# Patient Record
Sex: Female | Born: 1960 | Race: White | Hispanic: No | Marital: Married | Smoking: Light tobacco smoker
Health system: Southern US, Community
[De-identification: ages and names within clinical notes are randomized; demographics above are authoritative.]

## PROBLEM LIST (undated history)

## (undated) DIAGNOSIS — I10 Essential (primary) hypertension: Secondary | ICD-10-CM

## (undated) DIAGNOSIS — K589 Irritable bowel syndrome without diarrhea: Secondary | ICD-10-CM

## (undated) DIAGNOSIS — J42 Unspecified chronic bronchitis: Secondary | ICD-10-CM

## (undated) DIAGNOSIS — K4090 Unilateral inguinal hernia, without obstruction or gangrene, not specified as recurrent: Secondary | ICD-10-CM

## (undated) DIAGNOSIS — I639 Cerebral infarction, unspecified: Secondary | ICD-10-CM

## (undated) DIAGNOSIS — Q5128 Other doubling of uterus, other specified: Secondary | ICD-10-CM

## (undated) DIAGNOSIS — F329 Major depressive disorder, single episode, unspecified: Secondary | ICD-10-CM

## (undated) DIAGNOSIS — R011 Cardiac murmur, unspecified: Secondary | ICD-10-CM

## (undated) DIAGNOSIS — E7800 Pure hypercholesterolemia, unspecified: Secondary | ICD-10-CM

## (undated) DIAGNOSIS — R569 Unspecified convulsions: Secondary | ICD-10-CM

## (undated) DIAGNOSIS — T7840XA Allergy, unspecified, initial encounter: Secondary | ICD-10-CM

## (undated) DIAGNOSIS — F32A Depression, unspecified: Secondary | ICD-10-CM

## (undated) DIAGNOSIS — G3184 Mild cognitive impairment, so stated: Secondary | ICD-10-CM

## (undated) DIAGNOSIS — R112 Nausea with vomiting, unspecified: Secondary | ICD-10-CM

## (undated) DIAGNOSIS — M1990 Unspecified osteoarthritis, unspecified site: Secondary | ICD-10-CM

## (undated) DIAGNOSIS — Q512 Other doubling of uterus, unspecified: Secondary | ICD-10-CM

## (undated) DIAGNOSIS — K635 Polyp of colon: Secondary | ICD-10-CM

## (undated) DIAGNOSIS — R51 Headache: Secondary | ICD-10-CM

## (undated) DIAGNOSIS — R519 Headache, unspecified: Secondary | ICD-10-CM

## (undated) DIAGNOSIS — IMO0001 Reserved for inherently not codable concepts without codable children: Secondary | ICD-10-CM

## (undated) DIAGNOSIS — F419 Anxiety disorder, unspecified: Secondary | ICD-10-CM

## (undated) DIAGNOSIS — K219 Gastro-esophageal reflux disease without esophagitis: Secondary | ICD-10-CM

## (undated) DIAGNOSIS — G43909 Migraine, unspecified, not intractable, without status migrainosus: Secondary | ICD-10-CM

## (undated) DIAGNOSIS — I619 Nontraumatic intracerebral hemorrhage, unspecified: Secondary | ICD-10-CM

## (undated) DIAGNOSIS — Z9889 Other specified postprocedural states: Secondary | ICD-10-CM

## (undated) HISTORY — DX: Nontraumatic intracerebral hemorrhage, unspecified: I61.9

## (undated) HISTORY — DX: Essential (primary) hypertension: I10

## (undated) HISTORY — DX: Polyp of colon: K63.5

## (undated) HISTORY — PX: EXCISIONAL HEMORRHOIDECTOMY: SHX1541

## (undated) HISTORY — PX: COLONOSCOPY W/ BIOPSIES AND POLYPECTOMY: SHX1376

## (undated) HISTORY — DX: Other and unspecified doubling of uterus: Q51.28

## (undated) HISTORY — DX: Other doubling of uterus, unspecified: Q51.20

---

## 1979-04-20 HISTORY — PX: ANAL FISSURE REPAIR: SHX2312

## 1996-04-19 HISTORY — PX: TUBAL LIGATION: SHX77

## 2001-04-19 HISTORY — PX: VAGINAL HYSTERECTOMY: SUR661

## 2004-04-19 DIAGNOSIS — I619 Nontraumatic intracerebral hemorrhage, unspecified: Secondary | ICD-10-CM

## 2004-04-19 HISTORY — DX: Nontraumatic intracerebral hemorrhage, unspecified: I61.9

## 2004-12-23 ENCOUNTER — Encounter: Payer: Self-pay | Admitting: Family Medicine

## 2005-04-19 HISTORY — PX: ABDOMINOPLASTY: SUR9

## 2005-04-19 HISTORY — PX: AUGMENTATION MAMMAPLASTY: SUR837

## 2009-05-20 HISTORY — PX: BREAST IMPLANT REMOVAL: SHX5361

## 2010-01-08 ENCOUNTER — Encounter: Payer: Self-pay | Admitting: Family Medicine

## 2010-03-25 ENCOUNTER — Ambulatory Visit: Payer: Self-pay | Admitting: Family Medicine

## 2010-03-25 DIAGNOSIS — Z860100 Personal history of colon polyps, unspecified: Secondary | ICD-10-CM | POA: Insufficient documentation

## 2010-03-25 DIAGNOSIS — Z8673 Personal history of transient ischemic attack (TIA), and cerebral infarction without residual deficits: Secondary | ICD-10-CM

## 2010-03-25 DIAGNOSIS — F4481 Dissociative identity disorder: Secondary | ICD-10-CM

## 2010-03-25 DIAGNOSIS — I10 Essential (primary) hypertension: Secondary | ICD-10-CM | POA: Insufficient documentation

## 2010-04-23 ENCOUNTER — Encounter: Payer: Self-pay | Admitting: Family Medicine

## 2010-04-30 ENCOUNTER — Encounter: Payer: Self-pay | Admitting: Family Medicine

## 2010-04-30 DIAGNOSIS — R928 Other abnormal and inconclusive findings on diagnostic imaging of breast: Secondary | ICD-10-CM | POA: Insufficient documentation

## 2010-05-01 ENCOUNTER — Telehealth: Payer: Self-pay | Admitting: Family Medicine

## 2010-05-05 ENCOUNTER — Encounter: Payer: Self-pay | Admitting: Family Medicine

## 2010-05-19 NOTE — Assessment & Plan Note (Signed)
Summary: NEW PT TO EST/CLE   Vital Signs:  Patient profile:   50 year old female Height:      63.75 inches Weight:      130.75 pounds BMI:     22.70 Temp:     98.5 degrees F oral Pulse rate:   72 / minute Pulse rhythm:   regular BP sitting:   120 / 82  (left arm) Cuff size:   regular  Vitals Entered By: Linde Gillis CMA Duncan Dull) Apr 13, 2010 10:53 AM) CC: new patient, establish care   History of Present Illness: 50 yo with complicated PMH here to establish care without complaints.  1.  s/p parietal right hemorrhagic stroke in 2006- residual word finding difficulties, looses "time,", emotional liability.  Followed by Dr. Hyacinth Meeker (neuro), along with neuropsych.  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  2.   DID- diagnosed after her stroke (see above).  Had episodes of lapses in time.  Would see wet foot prints outside of her house after she took a shower but did not remember going outside.  Was committed in psychiatric facility at one point after a suicide attempt- took narcotics with ETOH.  Has had no further SI.  Also followed by Dr. Madelaine Etienne. On multiple medications, including Namenda 10 mg daily, Zonisamine 500 mg daily, Lexapro 30 mg daily, Cyclobenzaprine 10 mg at bedtime as needed.  3.  HTN- on Bisprolol and HCTZ.  Denies any CP, SOB, blurred vision or LE edema.    Preventive Screening-Counseling & Management  Alcohol-Tobacco     Smoking Status: current      Drug Use:  no.    Current Medications (verified): 1)  Namenda 10 Mg Tabs (Memantine Hcl) .... Take One Tablet By Mouth Daily 2)  Zonisamide 100 Mg Caps (Zonisamide) .... Take 500mg  Daily 3)  Lexapro 20 Mg Tabs (Escitalopram Oxalate) .... Take 30mg  Daily 4)  Lexapro 10 Mg Tabs (Escitalopram Oxalate) .... Take One Tablet By Mouth Daily 5)  Lisinopril-Hydrochlorothiazide 20-12.5 Mg Tabs (Lisinopril-Hydrochlorothiazide) .... Take One Tablet By Mouth Daily 6)  Fish  Oil 1000 Mg Caps (Omega-3 Fatty Acids) .... Take One Tablet By Mouth Daily 7)  Vitamin E 400 Unit Caps (Vitamin E) .... Take One Tablet Every Other Day 8)  Biotin 5000 5 Mg Caps (Biotin) .... Take One Tablet By Mouth Daily 9)  Cvs Soy Menopause 55 Mg Tabs (Soy Isoflavone) .... Take One Tablet By Mouth Daily 10)  Epipen 2-Pak 0.3 Mg/0.10ml Devi (Epinephrine) .... Use As Directed 11)  Cyclobenzaprine Hcl 10 Mg Tabs (Cyclobenzaprine Hcl) .... Take One Tablet At Bedtime As Needed 12)  Clorazepate Dipotassium 15 Mg Tabs (Clorazepate Dipotassium) .... Take One Tablet By Mouth Daily As Needed  Allergies (verified): 1)  ! 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  Married Current Smoker Alcohol use-yes Drug use-no  Risk Factors: Smoking Status: current (2010/04/13)  Past Medical History: Right parietal hemorrhagic stroke 2006- Dr. Hyacinth Meeker (neuro), Dr. Moise Boring (neuropsych) DID- Dr. 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  Married Current Smoker Alcohol use-yes Drug use-no Smoking Status:  current Drug Use:  no  Review of Systems      See HPI General:  Denies malaise. Eyes:  Denies blurring. ENT:  Denies difficulty swallowing. CV:  Denies chest pain or discomfort. Resp:  Denies shortness of breath. GI:  Denies abdominal pain and change in bowel habits. GU:  Denies discharge and dysuria. MS:  Denies joint pain, joint redness, and joint swelling. Derm:   Denies rash. Neuro:  Denies headaches. Psych:  Denies sense of great danger, suicidal thoughts/plans, thoughts of violence, unusual visions or sounds, and thoughts /plans of harming others. Endo:  Denies cold intolerance and heat intolerance. Heme:  Denies abnormal bruising and bleeding.  Physical Exam  General:  alert, well-developed, well-nourished, and well-hydrated.   Head:  normocephalic and atraumatic.   Eyes:  vision grossly intact, pupils equal, pupils round, and pupils reactive to light.   Ears:  R ear normal and L ear normal.   Nose:  no external deformity.   Mouth:  good dentition.   Neck:  No deformities, masses, or tenderness noted. Lungs:  Normal respiratory effort, chest expands symmetrically. Lungs are clear to auscultation, no crackles or wheezes. Heart:  Normal rate and regular rhythm. S1 and S2 normal without gallop, murmur, click, rub or other extra sounds. Abdomen:  Bowel sounds positive,abdomen soft and non-tender without masses, organomegaly or hernias noted. Msk:  No deformity or scoliosis noted of thoracic or lumbar spine.   Extremities:  no edema Neurologic:  alert & oriented X3, cranial nerves II-XII intact, strength normal in all extremities, gait normal, and DTRs symmetrical and normal.   Skin:  Intact without suspicious lesions or rashes Psych:  Cognition and judgment appear intact. Alert and cooperative with normal attention span and concentration. No apparent delusions, illusions, hallucinations   Impression & Recommendations:  Problem # 1:  CEREBROVASCULAR ACCIDENT, HX OF (ICD-V12.50) Assessment Unchanged Time spent with patient 45 minutes, more than 50% of this time was spent discussing her stroke and subsequent problems and referral she has had since then with pt and her husband. Awaiting records from neurology and neuropsych.  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 Lorenda Cahill. Pt is very  suspicious of this diagnosis, does not believe that she has DID. She admits to "loosing time" but not to the other features associated with this diagnosis. Sees Dr. Sherrine Maples weekly.  Problem # 3:  HYPERTENSION (ICD-401.9) Assessment: Unchanged Stable on current medications. Her updated medication list for this problem includes:    Lisinopril-hydrochlorothiazide 20-12.5 Mg Tabs (Lisinopril-hydrochlorothiazide) .Marland Kitchen... Take one tablet by mouth daily  Complete Medication List: 1)  Namenda 10 Mg Tabs (Memantine hcl) .... Take one tablet by mouth daily 2)  Zonisamide 100 Mg Caps (Zonisamide) .... Take 500mg  daily 3)  Lexapro 20 Mg Tabs (Escitalopram oxalate) .... Take 30mg  daily 4)  Lexapro 10 Mg Tabs (Escitalopram oxalate) .... Take one tablet by mouth daily 5)  Lisinopril-hydrochlorothiazide 20-12.5 Mg Tabs (Lisinopril-hydrochlorothiazide) .... Take one tablet by mouth daily 6)  Fish Oil 1000 Mg Caps (Omega-3 fatty acids) .... Take one tablet by mouth daily 7)  Vitamin E 400 Unit Caps (Vitamin e) .... Take one tablet every other day 8)  Biotin 5000 5 Mg Caps (Biotin) .... Take one tablet by mouth daily 9)  Cvs Soy Menopause 55 Mg Tabs (Soy isoflavone) .... Take one tablet by mouth daily 10)  Epipen 2-pak  0.3 Mg/0.70ml Devi (Epinephrine) .... Use as directed 11)  Cyclobenzaprine Hcl 10 Mg Tabs (Cyclobenzaprine hcl) .... Take one tablet at bedtime as needed 12)  Clorazepate Dipotassium 15 Mg Tabs (Clorazepate dipotassium) .... Take one tablet by mouth daily as needed  Other Orders: Radiology Referral (Radiology)  Patient Instructions: 1)  It was great to meet you. 2)  Please stop by to see Shirlee Limerick on your way out to set up your mammogram.   Orders Added: 1)  Radiology Referral [Radiology] 2)  New Patient Level IV [16109]    Current Allergies (reviewed today): ! PENICILLIN  TD Result Date:  02/21/2007 TD Result:  historical Flex Sig Next Due:  Not Indicated Colonoscopy Result Date:   11/27/2009 Colonoscopy Result:  historical Hemoccult Next Due:  Not Indicated PAP Next Due:  Not Indicated

## 2010-05-21 NOTE — Letter (Signed)
Summary: [facility]   Imported By: Maryln Gottron 04/03/2010 14:32:01  _____________________________________________________________________  External Attachment:    Type:   Image     Comment:   External Document

## 2010-05-21 NOTE — Progress Notes (Signed)
Summary: needs order for biopsy  Phone Note From Other Clinic   Caller: Roddie Mc with [facility]  [phone] Summary of Call: Pt is going for biopsy on 1/17 and they are asking for an order to be faxed to them at [phone], Aaron Mose.  Order needs to be for right ultrasound guided breast biopsy.  They are asking that you include in order whether you want them to give pt results, or do you want to give them. Initial call taken by: Lowella Petties CMA, AAMA,  May 01, 2010 4:17 PM

## 2010-05-21 NOTE — Letter (Signed)
Summary: [facility]  [facility]   Imported By: Lester [place] 04/10/2010 09:16:08  _____________________________________________________________________  External Attachment:    Type:   Image     Comment:   External Document

## 2010-05-21 NOTE — Letter (Signed)
Summary: Records Dated 05-08-08 thru 09-03-09/[facility]  Records Dated 05-08-08 thru 09-03-09/[facility]   Imported By: Lanelle Bal 04/10/2010 11:29:23  _____________________________________________________________________  External Attachment:    Type:   Image     Comment:   External Document

## 2010-05-21 NOTE — Miscellaneous (Signed)
Summary: Orders Update  Clinical Lists Changes  Problems: Added new problem of MAMMOGRAM, ABNORMAL, BILATERAL (ICD-793.80) Orders: Added new Referral order of Radiology Referral (Radiology) - Signed

## 2010-05-21 NOTE — Op Note (Signed)
Summary: Ultrasound Guided Needle Core Biopsy-Right,[facility]  Ultrasound Guided Needle Core Biopsy-Right,[facility]   Imported By: Beau Fanny 05/07/2010 11:06:29  _____________________________________________________________________  External Attachment:    Type:   Image     Comment:   External Document

## 2010-05-21 NOTE — Progress Notes (Signed)
  Phone Note Call from Patient   Caller: Patient Summary of Call: Called the patient to ask about her additional views. She went for them yesterday. They told her that she will need some biopsys and they will be contacting Dr Dayton Martes to let her know .  Initial call taken by: Carlton Adam,  May 01, 2010 11:27 AM  Follow-up for Phone Call        thank you. Ruthe Mannan MD  May 01, 2010 11:28 AM

## 2010-06-25 NOTE — Op Note (Signed)
Summary: [place] Bx RT Breast/[facility]  [place] Bx RT Breast/[facility]   Imported By: Sherian Rein 06/15/2010 10:20:55  _____________________________________________________________________  External Attachment:    Type:   Image     Comment:   External Document  Appended Document: [place] Bx RT Breast/[facility] Colon, do we need to enter this order?  Appended Document: [place] Bx RT Breast/[facility] Called [facility],patient is scheduled for Bilateral US on 11/05/2010 at 2pm, No order is required for this study.

## 2010-07-15 ENCOUNTER — Encounter: Payer: Self-pay | Admitting: Family Medicine

## 2010-07-16 ENCOUNTER — Ambulatory Visit: Payer: Self-pay | Admitting: Family Medicine

## 2010-09-07 ENCOUNTER — Ambulatory Visit (INDEPENDENT_AMBULATORY_CARE_PROVIDER_SITE_OTHER): Payer: BC Managed Care – PPO | Admitting: Family Medicine

## 2010-09-07 ENCOUNTER — Encounter: Payer: Self-pay | Admitting: Family Medicine

## 2010-09-07 VITALS — BP 138/80 | HR 86 | Temp 99.1°F | Ht 63.75 in | Wt 131.0 lb

## 2010-09-07 DIAGNOSIS — Z136 Encounter for screening for cardiovascular disorders: Secondary | ICD-10-CM

## 2010-09-07 DIAGNOSIS — R079 Chest pain, unspecified: Secondary | ICD-10-CM | POA: Insufficient documentation

## 2010-09-07 DIAGNOSIS — R0789 Other chest pain: Secondary | ICD-10-CM

## 2010-09-07 DIAGNOSIS — R071 Chest pain on breathing: Secondary | ICD-10-CM

## 2010-09-07 NOTE — Progress Notes (Signed)
50 yo with complicated PMH here breast/arm pain.   In February,  she had a right breast biopsy, recommended follow up ultrasound in 6 months.  Since the biopsy, she has had right breast/chest wall pain.  Pain is intermittent but very sharp in nature- 10/10.  Occurs mainly at rest but can occur at anytime. She at first though it was due to the biopsy but assumed it would have resolved by now since it has been three months. No nipple changes or discharge.  At times, pain radiated down her right shoulder as well. No UE weakness. No SOB, no diaphoresis.  Pt is a smoker, she has been coughing more than usual- green sputum. No fevers or chills.    s/p parietal right hemorrhagic stroke in 2006- residual word finding difficulties, looses "time,", emotional liability. Followed by Dr. Hyacinth Meeker (neuro), along with neuropsych. 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   2. DID- diagnosed after her stroke (see above). Had episodes of lapses in time. Would see wet foot prints outside of her house after she took a shower but did not remember going outside. Was committed in psychiatric facility at one point after a suicide attempt- took narcotics with ETOH. Has had no further SI. Also followed by Dr. Madelaine Etienne.  On multiple medications, including Namenda 10 mg daily, Zonisamine 500 mg daily, Lexapro 30 mg daily, Cyclobenzaprine 10 mg at bedtime as needed.  3. HTN- on Bisprolol and HCTZ. Denies any CP, SOB, blurred vision or LE edema.    The PMH, PSH, Social History, Family History, Medications, and allergies have been reviewed in [facility], and have been updated if relevant.  Review of Systems  See HPI  General: Denies malaise.  Eyes: Denies blurring.  ENT: Denies difficulty swallowing.  Resp: Denies shortness of breath, pos productive cough.  GI: Denies abdominal pain, pos diarrhea/IBS symptoms GU: Denies discharge and dysuria.  MS: Denies joint pain, joint  redness, and joint swelling.  Derm: Denies rash.  Neuro: Denies headaches.  Psych: Denies sense of great danger, suicidal thoughts/plans, thoughts of violence, unusual visions or sounds, and thoughts /plans of harming others.  Endo: Denies cold intolerance and heat intolerance.  Heme: Denies abnormal bruising and bleeding.   Physical Exam  BP 138/80  Pulse 86  Temp(Src) 99.1 F (37.3 C) (Oral)  Ht 5' 3.75" (1.619 m)  Wt 131 lb (59.421 kg)  BMI 22.66 kg/m2 General: alert, well-developed, well-nourished, and well-hydrated.  Head: normocephalic and atraumatic.  Eyes: vision grossly intact, pupils equal, pupils round, and pupils reactive to light.  Ears: R ear normal and L ear normal.  Nose: no external deformity.  Mouth: good dentition.  Neck: No deformities, masses, or tenderness noted.  Right breast- palpable fibrocystic disease, mildly ttp over right medial chest wall, along sternal border Lungs: Normal respiratory effort, chest expands symmetrically. Lungs are clear to auscultation, no crackles or wheezes.  Heart: Normal rate and regular rhythm. S1 and S2 normal without gallop, murmur, click, rub or other extra sounds.  Abdomen: Bowel sounds positive,abdomen soft and non-tender without masses, organomegaly or hernias noted.  Msk: No deformity or scoliosis noted of thoracic or lumbar spine.  Empty can Neg. Neg arch sign Extremities: no edema  Neurologic: alert & oriented X3, cranial nerves II-XII intact, strength normal in all extremities, gait normal, and DTRs symmetrical and normal.  Skin: Intact without suspicious lesions or rashes  Psych: Cognition and judgment appear intact. Alert and cooperative with normal  attention span and concentration. No apparent delusions, illusions, hallucinations

## 2010-09-07 NOTE — Assessment & Plan Note (Signed)
New with unclear etiology. I am assuming multifactorial - costochondritis from "smoker's cough," as well as possible some nerve pain due to breast biopsy? EKG within normal limits.   Will get CXR as well. If it is normal, will repeat breast ultrasound now rather than waiting until August. The patient indicates understanding of these issues and agrees with the plan.

## 2010-09-08 ENCOUNTER — Ambulatory Visit (INDEPENDENT_AMBULATORY_CARE_PROVIDER_SITE_OTHER)
Admission: RE | Admit: 2010-09-08 | Discharge: 2010-09-08 | Disposition: A | Discharge status: Home / Self Care | Payer: BC Managed Care – PPO | Source: Ambulatory Visit | Attending: Family Medicine | Admitting: Family Medicine

## 2010-09-08 DIAGNOSIS — R071 Chest pain on breathing: Secondary | ICD-10-CM

## 2010-09-08 DIAGNOSIS — R0789 Other chest pain: Secondary | ICD-10-CM

## 2010-09-09 ENCOUNTER — Other Ambulatory Visit: Payer: Self-pay | Admitting: Family Medicine

## 2010-09-09 DIAGNOSIS — N644 Mastodynia: Secondary | ICD-10-CM

## 2010-09-09 DIAGNOSIS — R0789 Other chest pain: Secondary | ICD-10-CM

## 2010-09-11 ENCOUNTER — Other Ambulatory Visit: Payer: Self-pay | Admitting: Family Medicine

## 2010-09-11 DIAGNOSIS — R928 Other abnormal and inconclusive findings on diagnostic imaging of breast: Secondary | ICD-10-CM

## 2013-03-20 ENCOUNTER — Other Ambulatory Visit: Payer: Self-pay | Admitting: Pulmonary Disease

## 2013-03-20 DIAGNOSIS — Q2572 Congenital pulmonary arteriovenous malformation: Secondary | ICD-10-CM

## 2013-03-29 ENCOUNTER — Ambulatory Visit
Admission: RE | Admit: 2013-03-29 | Discharge: 2013-03-29 | Disposition: A | Discharge status: Home / Self Care | Payer: BC Managed Care – PPO | Source: Ambulatory Visit | Attending: Pulmonary Disease | Admitting: Pulmonary Disease

## 2013-03-29 DIAGNOSIS — Q2572 Congenital pulmonary arteriovenous malformation: Secondary | ICD-10-CM

## 2014-02-18 ENCOUNTER — Other Ambulatory Visit: Payer: Self-pay | Admitting: Cardiology

## 2014-02-18 DIAGNOSIS — Q2572 Congenital pulmonary arteriovenous malformation: Secondary | ICD-10-CM

## 2014-02-27 ENCOUNTER — Ambulatory Visit
Admission: RE | Admit: 2014-02-27 | Discharge: 2014-02-27 | Disposition: A | Discharge status: Home / Self Care | Payer: BC Managed Care – PPO | Source: Ambulatory Visit | Attending: Cardiology | Admitting: Cardiology

## 2014-02-27 DIAGNOSIS — Q2572 Congenital pulmonary arteriovenous malformation: Secondary | ICD-10-CM

## 2014-03-04 NOTE — Consult Note (Signed)
Chief Complaint: Chief Complaint  Patient presents with  . Advice Only    Pulmonary AVM      Referring Physician(s): Daniel,Kurt R.  History of Present Illness: Priscilla Duke is a 53 y.o. 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 The patient sustained a stroke in 2006 which left her with difficulty swallowing and memory and processing deficits.  A right middle lobe AVM was identified by CT in November, 2014.  The patient was seen by Dr. Daryll Brod in Dec., 2014 in consultation for possible treatment of the AVM.  As she was asymptomatic, follow up was recommended.  The patient then sustained another cerebral infarct in September of this year with multifocal left MCA distribution infarcts resulting in right sided arm and leg weakness, expressive word finding deficit, processing deficit and vestibular deficit.  She was referred to Dr. Bishop Limbo who has excluded a patent foramen ovale or other cardiac shunts by echocardiography.  Priscilla Duke does complain of some periodic shortness of breath and periodic right chest pain.  She denies hemoptysis.  Past Medical History  Diagnosis Date  . 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 Didelphic uterus     Dr. Lennart Pall  . Colonic polyp   . Hypertension     Past Surgical History  Procedure Laterality Date  . Vaginal hysterectomy  2003  . Hemorrhoid surgery    . Tummy tuck   05/2009    breast lift     Allergies: Shellfish allergy; Olive oil; and Penicillins  Medications: Prior to Admission medications   Medication Sig Start Date End Date Taking? Authorizing Provider  apixaban (ELIQUIS) 5 MG TABS tablet Take 5 mg by mouth 2 (two) times daily.   Yes Historical Provider, MD  aspirin 81 MG tablet Take 81 mg by mouth daily.   Yes Historical Provider, MD  atorvastatin (LIPITOR) 80 MG tablet Take 80 mg by mouth daily.    Yes Historical Provider, MD  bisoprolol-hydrochlorothiazide (ZIAC) 5-6.25 MG per tablet Take 1 tablet by mouth daily.   Yes Historical Provider, MD  Doxycycline Hyclate 50 MG TABS Take 100 mg by mouth daily.   Yes Historical Provider, MD  EPINEPHrine (EPIPEN 2-PAK) 0.3 mg/0.3 mL DEVI Use as directed    Yes Historical Provider, MD  escitalopram (LEXAPRO) 10 MG tablet Take 10 mg by mouth daily.     Yes Historical Provider, MD  fluorometholone (FML FORTE) 0.25 % ophthalmic suspension 1 drop 2 (two) times daily.   Yes Historical Provider, MD  memantine (NAMENDA) 10 MG tablet Take 10 mg by mouth daily.     Yes Historical Provider, MD  zonisamide (ZONEGRAN) 100 MG capsule Take 500 mg by mouth daily.     Yes Historical Provider, MD  Biotin (BIOTIN 5000) 5 MG CAPS Take 1 capsule by mouth daily.      Historical Provider, MD  clorazepate (TRANXENE) 15 MG tablet Take 15 mg by mouth daily.      Historical Provider, MD  cyclobenzaprine (FLEXERIL) 10 MG tablet Take 10 mg by mouth at bedtime.      Historical Provider, MD  escitalopram (LEXAPRO) 20 MG tablet Take 20 mg by mouth daily.      Historical Provider, MD  fish oil-omega-3 fatty acids 1000 MG capsule Take 1 g by mouth daily.      Historical Provider, MD  hydroxychloroquine (PLAQUENIL) 200 MG tablet Take  by mouth daily.    Historical Provider, MD  hyoscyamine (LEVBID) 0.375 MG 12 hr tablet Take 0.375 mg by mouth 2 (two) times daily.    Historical Provider, MD  lisinopril-hydrochlorothiazide (PRINZIDE,ZESTORETIC) 20-12.5 MG per tablet Take 1 tablet by mouth daily.      Historical Provider, MD  omeprazole (PRILOSEC) 40 MG capsule Take 40 mg by mouth daily.    Historical Provider, MD  ranitidine (ZANTAC) 300 MG tablet Take 300 mg by mouth at bedtime.    Historical Provider, MD  Soy Isoflavone ([employer] SOY MENOPAUSE) 55 MG TABS Take 1 tablet by mouth daily.      Historical Provider, MD  vitamin E 400 UNIT capsule Take 400 Units by mouth every other day.       Historical Provider, MD    Family History  Problem Relation Age of Onset  . Aneurysm Sister   . Hypertension Other     History   Social History  . Marital Status: Married    Spouse Name: N/A    Number of Children: 73  . Years of Education: N/A   Occupational History  . Disability, former speech pathologist    Social History Main Topics  . Smoking status: Current Every Day Smoker  . Smokeless tobacco: Not on file  . Alcohol Use: No  . Drug Use: No  . Sexual Activity: Not on file   Other Topics Concern  . Not on file   Social History Narrative  . No narrative on file     Review of Systems: A 12 point ROS discussed and pertinent positives are indicated in the HPI above.  All other systems are negative.  Review of Systems  HENT: Negative.   Respiratory: Positive for chest tightness. Negative for apnea, cough, shortness of breath, wheezing and stridor.   Cardiovascular: Negative.   Gastrointestinal: Negative.   Genitourinary: Negative.  Negative for difficulty urinating.  Musculoskeletal: Negative.   Skin: Negative.   Neurological: Positive for speech difficulty and weakness. Negative for dizziness, tremors, seizures, syncope, facial asymmetry, light-headedness, numbness and headaches.  Hematological: Negative.      Vital Signs: Ht $RemoveB'5\' 3"'leWcFBJZ$  (1.6 m)  Wt 130 lb (58.968 kg)  BMI 23.03 kg/m2  Physical Exam  Constitutional: She is oriented to person, place, and time. She appears well-developed and well-nourished. No distress.  HENT:  Head: Normocephalic and atraumatic.  Cardiovascular: Normal rate, regular rhythm, normal heart sounds and intact distal pulses.  Exam reveals no gallop and no friction rub.   No murmur heard. Pulmonary/Chest: Effort normal and breath sounds normal. No respiratory distress. She has no wheezes. She has no rales. She exhibits no tenderness.  Abdominal: Soft. Bowel sounds are normal. She exhibits no distension and no mass. There is no  tenderness. There is no rebound and no guarding. No hernia.  Musculoskeletal: She exhibits no edema or tenderness.  Neurological: She is alert and oriented to person, place, and time.  Skin: Skin is warm and dry. No rash noted. She is not diaphoretic. No erythema. No pallor.    Imaging: No results found.  Labs:  CBC: No results for input(s): WBC, HGB, HCT, PLT in the last 8760 hours.  COAGS: No results for input(s): INR, APTT in the last 8760 hours.  BMP: No results for input(s): NA, K, CL, CO2, GLUCOSE, BUN, CALCIUM, CREATININE, GFRNONAA, GFRAA in the last 8760 hours.  Invalid input(s): CMP  LIVER FUNCTION TESTS: No results for input(s): BILITOT, AST, ALT, ALKPHOS, PROT, ALBUMIN in the  last 8760 hours.  TUMOR MARKERS: No results for input(s): AFPTM, CEA, CA199, CHROMGRNA in the last 8760 hours.  Assessment and Plan:  I met with Priscilla Duke and her husband.  Imaging results were reviewed.  The AVM of the right middle lobe lies in the medial and anterior subpleural lung.  This is in the spectrum of AV fistula, with a direct communication seen between a right middle lobe pulmonary arterial branch and a pulmonary vein.  Without evidence of intracardiac shunting, this AVM is almost definitely the cause of the patient's two cerebral infarcts.  There clearly is a clinical indication to treat the malformation.  Risks of spontaneous hemorrhage, oxygen desaturation and development of heart failure in the future were also discussed as potential risks of not treating the malformation.  Technique and risks of transcatheter approach to the pulmonary AVM with arteriographic assessment and embolization were discussed.  Embolization techniques include use of coils, embolization plugs and liquid embolic agents such as Onyx and glue were discussed.  The AVM is in a location that would be approachable and likely treatable with transcatheter techniques.  Use of general anesthesia and its benefits and  risks were also discussed.  We would most likely observe Priscilla Duke overnight after treatment.  After discussion, the patient would like to proceed with treatment of the pulmonary AVM as soon as possible.  We will begin the authorization process and schedule the procedure at [facility].  I told the patient that access to a larger array of embolization coils there and ability to be assisted by Dr. Estanislado Pandy of the Neurointerventional service are an advantage to performing the procedure at [facility].  The patient is agreeable.  Thank you for this interesting consult.  I greatly enjoyed meeting Priscilla Duke and look forward to participating in Priscilla Duke.  I spent a total of 40 minutes face to face in clinical consultation, greater than 50% of which was counseling/coordinating Duke for treatment of the pulmonary AVM.        SignedAletta Edouard T 03/04/2014, 2:11 PM

## 2014-03-06 ENCOUNTER — Other Ambulatory Visit (HOSPITAL_COMMUNITY): Payer: Self-pay | Admitting: Interventional Radiology

## 2014-03-07 ENCOUNTER — Other Ambulatory Visit (HOSPITAL_COMMUNITY): Payer: Self-pay | Admitting: Interventional Radiology

## 2014-03-07 DIAGNOSIS — Q2572 Congenital pulmonary arteriovenous malformation: Secondary | ICD-10-CM

## 2014-03-15 ENCOUNTER — Other Ambulatory Visit: Payer: Self-pay | Admitting: Radiology

## 2014-03-27 ENCOUNTER — Encounter (HOSPITAL_COMMUNITY)
Admission: RE | Admit: 2014-03-27 | Discharge: 2014-03-27 | Disposition: A | Discharge status: Home / Self Care | Payer: BC Managed Care – PPO | Source: Ambulatory Visit | Attending: Interventional Radiology | Admitting: Interventional Radiology

## 2014-03-27 ENCOUNTER — Encounter (HOSPITAL_COMMUNITY): Payer: Self-pay

## 2014-03-27 DIAGNOSIS — R0602 Shortness of breath: Secondary | ICD-10-CM | POA: Diagnosis not present

## 2014-03-27 DIAGNOSIS — R413 Other amnesia: Secondary | ICD-10-CM | POA: Diagnosis not present

## 2014-03-27 DIAGNOSIS — F4481 Dissociative identity disorder: Secondary | ICD-10-CM | POA: Diagnosis not present

## 2014-03-27 DIAGNOSIS — R001 Bradycardia, unspecified: Secondary | ICD-10-CM | POA: Diagnosis not present

## 2014-03-27 DIAGNOSIS — Z8673 Personal history of transient ischemic attack (TIA), and cerebral infarction without residual deficits: Secondary | ICD-10-CM | POA: Insufficient documentation

## 2014-03-27 DIAGNOSIS — Z7982 Long term (current) use of aspirin: Secondary | ICD-10-CM | POA: Diagnosis not present

## 2014-03-27 DIAGNOSIS — R569 Unspecified convulsions: Secondary | ICD-10-CM | POA: Insufficient documentation

## 2014-03-27 DIAGNOSIS — Z7901 Long term (current) use of anticoagulants: Secondary | ICD-10-CM | POA: Diagnosis not present

## 2014-03-27 DIAGNOSIS — Z01818 Encounter for other preprocedural examination: Secondary | ICD-10-CM | POA: Insufficient documentation

## 2014-03-27 DIAGNOSIS — I10 Essential (primary) hypertension: Secondary | ICD-10-CM | POA: Diagnosis not present

## 2014-03-27 DIAGNOSIS — Z87891 Personal history of nicotine dependence: Secondary | ICD-10-CM | POA: Diagnosis not present

## 2014-03-27 DIAGNOSIS — E780 Pure hypercholesterolemia: Secondary | ICD-10-CM | POA: Diagnosis not present

## 2014-03-27 DIAGNOSIS — R011 Cardiac murmur, unspecified: Secondary | ICD-10-CM | POA: Insufficient documentation

## 2014-03-27 HISTORY — DX: Major depressive disorder, single episode, unspecified: F32.9

## 2014-03-27 HISTORY — DX: Irritable bowel syndrome, unspecified: K58.9

## 2014-03-27 HISTORY — DX: Gastro-esophageal reflux disease without esophagitis: K21.9

## 2014-03-27 HISTORY — DX: Other specified postprocedural states: Z98.890

## 2014-03-27 HISTORY — DX: Pure hypercholesterolemia, unspecified: E78.00

## 2014-03-27 HISTORY — DX: Other specified postprocedural states: R11.2

## 2014-03-27 HISTORY — DX: Cerebral infarction, unspecified: I63.9

## 2014-03-27 HISTORY — DX: Unspecified osteoarthritis, unspecified site: M19.90

## 2014-03-27 HISTORY — DX: Depression, unspecified: F32.A

## 2014-03-27 HISTORY — DX: Unspecified convulsions: R56.9

## 2014-03-27 HISTORY — DX: Headache, unspecified: R51.9

## 2014-03-27 HISTORY — DX: Headache: R51

## 2014-03-27 HISTORY — DX: Allergy, unspecified, initial encounter: T78.40XA

## 2014-03-27 HISTORY — DX: Cardiac murmur, unspecified: R01.1

## 2014-03-27 HISTORY — DX: Reserved for inherently not codable concepts without codable children: IMO0001

## 2014-03-27 HISTORY — DX: Anxiety disorder, unspecified: F41.9

## 2014-03-27 LAB — CBC WITH DIFFERENTIAL/PLATELET
Basophils Absolute: 0 10*3/uL (ref 0.0–0.1)
Basophils Relative: 0 % (ref 0–1)
EOS PCT: 3 % (ref 0–5)
Eosinophils Absolute: 0.2 10*3/uL (ref 0.0–0.7)
HEMATOCRIT: 37.8 % (ref 36.0–46.0)
Hemoglobin: 12.4 g/dL (ref 12.0–15.0)
LYMPHS ABS: 2.3 10*3/uL (ref 0.7–4.0)
LYMPHS PCT: 38 % (ref 12–46)
MCH: 29.1 pg (ref 26.0–34.0)
MCHC: 32.8 g/dL (ref 30.0–36.0)
MCV: 88.7 fL (ref 78.0–100.0)
Monocytes Absolute: 0.4 10*3/uL (ref 0.1–1.0)
Monocytes Relative: 8 % (ref 3–12)
Neutro Abs: 3 10*3/uL (ref 1.7–7.7)
Neutrophils Relative %: 51 % (ref 43–77)
PLATELETS: 217 10*3/uL (ref 150–400)
RBC: 4.26 MIL/uL (ref 3.87–5.11)
RDW: 13.4 % (ref 11.5–15.5)
WBC: 5.9 10*3/uL (ref 4.0–10.5)

## 2014-03-27 LAB — COMPREHENSIVE METABOLIC PANEL
ALK PHOS: 81 U/L (ref 39–117)
ALT: 30 U/L (ref 0–35)
AST: 26 U/L (ref 0–37)
Albumin: 4 g/dL (ref 3.5–5.2)
Anion gap: 11 (ref 5–15)
BUN: 16 mg/dL (ref 6–23)
CHLORIDE: 106 meq/L (ref 96–112)
CO2: 26 meq/L (ref 19–32)
Calcium: 9.1 mg/dL (ref 8.4–10.5)
Creatinine, Ser: 0.91 mg/dL (ref 0.50–1.10)
GFR, EST AFRICAN AMERICAN: 81 mL/min — AB (ref >90)
GFR, EST NON AFRICAN AMERICAN: 70 mL/min — AB (ref >90)
GLUCOSE: 98 mg/dL (ref 70–99)
Potassium: 4.2 mEq/L (ref 3.7–5.3)
SODIUM: 143 meq/L (ref 137–147)
Total Protein: 6.8 g/dL (ref 6.0–8.3)

## 2014-03-27 LAB — APTT: aPTT: 40 seconds — ABNORMAL HIGH (ref 24–37)

## 2014-03-27 LAB — PROTIME-INR
INR: 1.18 (ref 0.00–1.49)
Prothrombin Time: 15.1 seconds (ref 11.6–15.2)

## 2014-03-27 NOTE — Progress Notes (Signed)
Spoke with Ralene Muskrat, PA regarding pt Aspirin and Eliquis administration on day of procedure,04/04/14.  According to Rinaldo Cloud, " Aspirin is okay, do not take Eliquis on the 15 th and 16 th, last dose should be 12/14."

## 2014-03-27 NOTE — Pre-Procedure Instructions (Addendum)
Priscilla Duke  03/27/2014   Your procedure is scheduled on: Thursday, April 04, 2014  Report to [facility]mitting at 8:00 AM.  Call this number if you have problems the morning of surgery: [phone]   Remember: Last dose of Eliquis is Monday, April 01, 2014 (per PA instructions)   Do not eat food or drink liquids after midnight Wednesday, April 03, 2014   Take these medicines the morning of surgery with A SIP OF WATER: aspirin,  bisoprolol-hydrochlorothiazide (ZIAC), doxycycline (VIBRA), escitalopram (LEXAPRO), memantine (NAMENDA), zonisamide (ZONEGRAN), fluorometholone (FML FORTE) eye drops, if needed: EPINEPHrine (EPIPEN  Stop taking vitamins and herbal medications (fish oil-omega-3 fatty acids). Do not take any NSAIDs ie: Ibuprofen, Advil, Naproxen ( Aleve); stop 5 days prior to procedure ( Saturday, March 30, 2014).  Do not wear jewelry, make-up or nail polish.  Do not wear lotions, powders, or perfumes. You may not wear deodorant.   Do not shave 48 hours prior to surgery.   Do not bring valuables to the hospital.  [facility] is not responsible for any belongings or valuables.               Contacts, dentures or bridgework may not be worn into surgery.  Leave suitcase in the car. After surgery it may be brought to your room.  For patients admitted to the hospital, discharge time is determined by your treatment team.               Patients discharged the day of surgery will not be allowed to drive home.  Name and phone number of your driver:   Special Instructions:  Special Instructions:Special Instructions: [facility] - Preparing for Surgery  Before surgery, you can play an important role.  Because skin is not sterile, your skin needs to be as free of germs as possible.  You can reduce the number of germs on you skin by washing with CHG (chlorahexidine gluconate) soap before surgery.  CHG is an antiseptic cleaner which kills germs and bonds with the skin  to continue killing germs even after washing.  Please DO NOT use if you have an allergy to CHG or antibacterial soaps.  If your skin becomes reddened/irritated stop using the CHG and inform your nurse when you arrive at Short Stay.  Do not shave (including legs and underarms) for at least 48 hours prior to the first CHG shower.  You may shave your face.  Please follow these instructions carefully:   1.  Shower with CHG Soap the night before surgery and the morning of Surgery.  2.  If you choose to wash your hair, wash your hair first as usual with your normal shampoo.  3.  After you shampoo, rinse your hair and body thoroughly to remove the Shampoo.  4.  Use CHG as you would any other liquid soap.  You can apply chg directly  to the skin and wash gently with scrungie or a clean washcloth.  5.  Apply the CHG Soap to your body ONLY FROM THE NECK DOWN.  Do not use on open wounds or open sores.  Avoid contact with your eyes, ears, mouth and genitals (private parts).  Wash genitals (private parts) with your normal soap.  6.  Wash thoroughly, paying special attention to the area where your surgery will be performed.  7.  Thoroughly rinse your body with warm water from the neck down.  8.  DO NOT shower/wash with your normal soap after using and  rinsing off the CHG Soap.  9.  Pat yourself dry with a clean towel.            10.  Wear clean pajamas.            11.  Place clean sheets on your bed the night of your first shower and do not sleep with pets.  Day of Surgery  Do not apply any lotions/deodorants the morning of surgery.  Please wear clean clothes to the hospital/surgery center.   Please read over the following fact sheets that you were given: Pain Booklet, Coughing and Deep Breathing and Surgical Site Infection Prevention

## 2014-03-27 NOTE — Progress Notes (Signed)
Pt stated that she has had SOB her entire life but denies chest pain and being under the care of a cardiologist. Pt stated that Dr. Reuel Boom released her from his care. Pt PCP is Dr. Carolyne Fiscal at [facility].

## 2014-03-28 ENCOUNTER — Encounter (HOSPITAL_COMMUNITY): Payer: Self-pay

## 2014-03-28 NOTE — Progress Notes (Signed)
Anesthesia Chart Review:  Pt is 53 year old female scheduled for arteriographic assessment and embolization of pulmonary AVM on 04/04/2014 with Dr. Fredia Sorrow.   PMH: CVA (August 2015), hemorrhagic CVA (2006), HTN, heart murmur (unspecified), seizures, hypercholesterolemia, SOB (has had her "entire life"), memory impairment, dissociative identity disorder. Former smoker.   Medications include: ASA, eliquis. Per RN notes, Ralene Muskrat, PA with IR instructed for pt to take ASA, last dose eliquis on 12/14.   Preoperative labs reviewed.  PTT 40.   EKG 01/07/2014: sinus bradycardia (57 bpm), nonspecific ST changes.   TEE 02/13/2014: -Normal LV systolic function with no appreciable segmental abnormality.  -LV EF 55-60% -no PFO  If no changes, I anticipate pt can proceed with surgery as scheduled.   Rica Mast, FNP-BC [facility]/Anesthesiology Phone: [phone] 03/28/2014 2:47 PM

## 2014-03-29 ENCOUNTER — Other Ambulatory Visit: Payer: Self-pay | Admitting: Radiology

## 2014-04-02 ENCOUNTER — Other Ambulatory Visit: Payer: Self-pay | Admitting: Radiology

## 2014-04-04 ENCOUNTER — Observation Stay (HOSPITAL_COMMUNITY): Payer: BC Managed Care – PPO

## 2014-04-04 ENCOUNTER — Encounter (HOSPITAL_COMMUNITY)
Admission: RE | Disposition: A | Discharge status: Home / Self Care | Payer: Self-pay | Source: Ambulatory Visit | Attending: Interventional Radiology

## 2014-04-04 ENCOUNTER — Observation Stay (HOSPITAL_COMMUNITY)
Admission: RE | Admit: 2014-04-04 | Discharge: 2014-04-05 | Disposition: A | Discharge status: Home / Self Care | Payer: BC Managed Care – PPO | Source: Ambulatory Visit | Attending: Interventional Radiology | Admitting: Interventional Radiology

## 2014-04-04 ENCOUNTER — Ambulatory Visit (HOSPITAL_COMMUNITY): Payer: BC Managed Care – PPO | Admitting: Emergency Medicine

## 2014-04-04 ENCOUNTER — Ambulatory Visit (HOSPITAL_COMMUNITY)
Admission: RE | Admit: 2014-04-04 | Discharge: 2014-04-04 | Disposition: A | Discharge status: Home / Self Care | Payer: BC Managed Care – PPO | Source: Ambulatory Visit | Attending: Interventional Radiology | Admitting: Interventional Radiology

## 2014-04-04 ENCOUNTER — Encounter (HOSPITAL_COMMUNITY): Payer: Self-pay

## 2014-04-04 ENCOUNTER — Encounter (HOSPITAL_COMMUNITY): Payer: Self-pay | Admitting: *Deleted

## 2014-04-04 ENCOUNTER — Ambulatory Visit (HOSPITAL_COMMUNITY): Payer: BC Managed Care – PPO | Admitting: Anesthesiology

## 2014-04-04 DIAGNOSIS — M797 Fibromyalgia: Secondary | ICD-10-CM | POA: Insufficient documentation

## 2014-04-04 DIAGNOSIS — Z87891 Personal history of nicotine dependence: Secondary | ICD-10-CM | POA: Diagnosis not present

## 2014-04-04 DIAGNOSIS — R569 Unspecified convulsions: Secondary | ICD-10-CM | POA: Insufficient documentation

## 2014-04-04 DIAGNOSIS — Q2572 Congenital pulmonary arteriovenous malformation: Secondary | ICD-10-CM | POA: Diagnosis present

## 2014-04-04 DIAGNOSIS — Z8673 Personal history of transient ischemic attack (TIA), and cerebral infarction without residual deficits: Secondary | ICD-10-CM | POA: Insufficient documentation

## 2014-04-04 DIAGNOSIS — M1990 Unspecified osteoarthritis, unspecified site: Secondary | ICD-10-CM | POA: Diagnosis not present

## 2014-04-04 DIAGNOSIS — K219 Gastro-esophageal reflux disease without esophagitis: Secondary | ICD-10-CM | POA: Insufficient documentation

## 2014-04-04 DIAGNOSIS — J9811 Atelectasis: Secondary | ICD-10-CM | POA: Diagnosis not present

## 2014-04-04 DIAGNOSIS — R042 Hemoptysis: Secondary | ICD-10-CM | POA: Diagnosis not present

## 2014-04-04 DIAGNOSIS — I10 Essential (primary) hypertension: Secondary | ICD-10-CM | POA: Diagnosis not present

## 2014-04-04 DIAGNOSIS — I739 Peripheral vascular disease, unspecified: Secondary | ICD-10-CM | POA: Insufficient documentation

## 2014-04-04 DIAGNOSIS — R079 Chest pain, unspecified: Secondary | ICD-10-CM | POA: Insufficient documentation

## 2014-04-04 HISTORY — DX: Unilateral inguinal hernia, without obstruction or gangrene, not specified as recurrent: K40.90

## 2014-04-04 HISTORY — PX: RADIOLOGY WITH ANESTHESIA: SHX6223

## 2014-04-04 HISTORY — DX: Mild cognitive impairment of uncertain or unknown etiology: G31.84

## 2014-04-04 HISTORY — DX: Migraine, unspecified, not intractable, without status migrainosus: G43.909

## 2014-04-04 HISTORY — DX: Unspecified chronic bronchitis: J42

## 2014-04-04 LAB — POCT ACTIVATED CLOTTING TIME: Activated Clotting Time: 191 seconds

## 2014-04-04 SURGERY — RADIOLOGY WITH ANESTHESIA
Anesthesia: General

## 2014-04-04 MED ORDER — PHENYLEPHRINE HCL 10 MG/ML IJ SOLN
10.0000 mg | INTRAVENOUS | Status: DC | PRN
  Administered 2014-04-04: 40 ug/min via INTRAVENOUS

## 2014-04-04 MED ORDER — ESCITALOPRAM OXALATE 20 MG PO TABS
30.0000 mg | ORAL_TABLET | Freq: Every day | ORAL | Status: DC
  Administered 2014-04-04: 30 mg via ORAL
  Filled 2014-04-04 (×2): qty 1

## 2014-04-04 MED ORDER — LACTATED RINGERS IV SOLN
INTRAVENOUS | Status: DC | PRN
  Administered 2014-04-04 (×2): via INTRAVENOUS

## 2014-04-04 MED ORDER — MEMANTINE HCL 10 MG PO TABS
10.0000 mg | ORAL_TABLET | Freq: Every day | ORAL | Status: DC
  Administered 2014-04-05: 10 mg via ORAL
  Filled 2014-04-04 (×2): qty 1

## 2014-04-04 MED ORDER — NITROGLYCERIN 1 MG/10 ML FOR IR/CATH LAB
INTRA_ARTERIAL | Status: AC
  Filled 2014-04-04: qty 10

## 2014-04-04 MED ORDER — ONDANSETRON HCL 4 MG/2ML IJ SOLN
4.0000 mg | Freq: Four times a day (QID) | INTRAMUSCULAR | Status: DC | PRN
  Filled 2014-04-04: qty 2

## 2014-04-04 MED ORDER — HYDROMORPHONE HCL 1 MG/ML IJ SOLN
0.2500 mg | INTRAMUSCULAR | Status: DC | PRN
  Administered 2014-04-04 (×2): 0.25 mg via INTRAVENOUS

## 2014-04-04 MED ORDER — VANCOMYCIN HCL 1000 MG IV SOLR
1000.0000 mg | Freq: Once | INTRAVENOUS | Status: DC
  Filled 2014-04-04: qty 1000

## 2014-04-04 MED ORDER — HYDROMORPHONE HCL 1 MG/ML IJ SOLN
INTRAMUSCULAR | Status: AC
  Administered 2014-04-04: 0.25 mg via INTRAVENOUS
  Filled 2014-04-04: qty 1

## 2014-04-04 MED ORDER — PROPOFOL 10 MG/ML IV BOLUS
INTRAVENOUS | Status: DC | PRN
  Administered 2014-04-04: 160 mg via INTRAVENOUS

## 2014-04-04 MED ORDER — LIDOCAINE HCL (CARDIAC) 20 MG/ML IV SOLN
INTRAVENOUS | Status: DC | PRN
  Administered 2014-04-04: 100 mg via INTRAVENOUS

## 2014-04-04 MED ORDER — NEOSTIGMINE METHYLSULFATE 10 MG/10ML IV SOLN
INTRAVENOUS | Status: DC | PRN
  Administered 2014-04-04: 3 mg via INTRAVENOUS

## 2014-04-04 MED ORDER — BISOPROLOL-HYDROCHLOROTHIAZIDE 5-6.25 MG PO TABS
1.0000 | ORAL_TABLET | Freq: Every day | ORAL | Status: DC
  Administered 2014-04-05: 1 via ORAL
  Filled 2014-04-04 (×2): qty 1

## 2014-04-04 MED ORDER — GLYCOPYRROLATE 0.2 MG/ML IJ SOLN
INTRAMUSCULAR | Status: DC | PRN
  Administered 2014-04-04: 0.4 mg via INTRAVENOUS

## 2014-04-04 MED ORDER — ESCITALOPRAM OXALATE 10 MG PO TABS
10.0000 mg | ORAL_TABLET | Freq: Every day | ORAL | Status: DC
  Administered 2014-04-05: 10 mg via ORAL
  Filled 2014-04-04 (×2): qty 1

## 2014-04-04 MED ORDER — SUCCINYLCHOLINE CHLORIDE 20 MG/ML IJ SOLN
INTRAMUSCULAR | Status: DC | PRN
  Administered 2014-04-04: 80 mg via INTRAVENOUS

## 2014-04-04 MED ORDER — ESCITALOPRAM OXALATE 10 MG PO TABS
10.0000 mg | ORAL_TABLET | Freq: Two times a day (BID) | ORAL | Status: DC
Start: 2014-04-04 — End: 2014-04-04

## 2014-04-04 MED ORDER — ONDANSETRON HCL 4 MG/2ML IJ SOLN: 4.0000 mg | Freq: Once | INTRAMUSCULAR | Status: DC | PRN

## 2014-04-04 MED ORDER — HYDROCODONE-ACETAMINOPHEN 5-325 MG PO TABS
1.0000 | ORAL_TABLET | ORAL | Status: DC | PRN
  Administered 2014-04-04 – 2014-04-05 (×3): 2 via ORAL
  Filled 2014-04-04 (×3): qty 2

## 2014-04-04 MED ORDER — VANCOMYCIN HCL IN DEXTROSE 1-5 GM/200ML-% IV SOLN
INTRAVENOUS | Status: AC
  Administered 2014-04-04: 1000 mg via INTRAVENOUS
  Filled 2014-04-04: qty 200

## 2014-04-04 MED ORDER — HEPARIN SODIUM (PORCINE) 1000 UNIT/ML IJ SOLN
INTRAMUSCULAR | Status: DC | PRN
  Administered 2014-04-04: 1 mL via INTRAVENOUS
  Administered 2014-04-04: 3 mL via INTRAVENOUS

## 2014-04-04 MED ORDER — SODIUM CHLORIDE 0.9 % IV SOLN
INTRAVENOUS | Status: DC
  Administered 2014-04-05: 02:00:00 via INTRAVENOUS

## 2014-04-04 MED ORDER — LACTATED RINGERS IV SOLN
INTRAVENOUS | Status: DC | PRN
  Administered 2014-04-04: 11:00:00 via INTRAVENOUS

## 2014-04-04 MED ORDER — SODIUM CHLORIDE 0.9 % IV SOLN: Freq: Once | INTRAVENOUS | Status: DC

## 2014-04-04 MED ORDER — ROCURONIUM BROMIDE 100 MG/10ML IV SOLN
INTRAVENOUS | Status: DC | PRN
  Administered 2014-04-04: 30 mg via INTRAVENOUS

## 2014-04-04 MED ORDER — ZONISAMIDE 100 MG PO CAPS
300.0000 mg | ORAL_CAPSULE | Freq: Two times a day (BID) | ORAL | Status: DC
  Administered 2014-04-04 – 2014-04-05 (×2): 300 mg via ORAL
  Filled 2014-04-04 (×3): qty 3

## 2014-04-04 MED ORDER — LIDOCAINE HCL 1 % IJ SOLN
INTRAMUSCULAR | Status: AC
  Filled 2014-04-04: qty 20

## 2014-04-04 MED ORDER — ATORVASTATIN CALCIUM 80 MG PO TABS
80.0000 mg | ORAL_TABLET | Freq: Every day | ORAL | Status: DC
  Administered 2014-04-04: 80 mg via ORAL
  Filled 2014-04-04 (×2): qty 1

## 2014-04-04 MED ORDER — ASPIRIN EC 81 MG PO TBEC
81.0000 mg | DELAYED_RELEASE_TABLET | Freq: Every day | ORAL | Status: DC
  Administered 2014-04-05: 81 mg via ORAL
  Filled 2014-04-04 (×2): qty 1

## 2014-04-04 MED ORDER — ONDANSETRON HCL 4 MG/2ML IJ SOLN
INTRAMUSCULAR | Status: DC | PRN
  Administered 2014-04-04: 4 mg via INTRAVENOUS

## 2014-04-04 MED ORDER — IOHEXOL 300 MG/ML  SOLN
150.0000 mL | Freq: Once | INTRAMUSCULAR | Status: AC | PRN
  Administered 2014-04-04: 150 mL via INTRAVENOUS

## 2014-04-04 MED ORDER — FENTANYL CITRATE 0.05 MG/ML IJ SOLN
INTRAMUSCULAR | Status: DC | PRN
  Administered 2014-04-04: 100 ug via INTRAVENOUS

## 2014-04-04 MED ORDER — DOXYCYCLINE HYCLATE 100 MG PO TABS
100.0000 mg | ORAL_TABLET | ORAL | Status: DC
  Administered 2014-04-05: 100 mg via ORAL
  Filled 2014-04-04 (×2): qty 1

## 2014-04-04 MED ORDER — LACTATED RINGERS IV SOLN
INTRAVENOUS | Status: DC
  Administered 2014-04-04: 11:00:00 via INTRAVENOUS

## 2014-04-04 NOTE — Progress Notes (Signed)
   Referring Physician(s): Dr Fredia Sorrow  Subjective:  RML pulmonary AVM embolization Procedure went well Pt tolerated well In PACU pt developed some mild hemoptysis Called to bedside Dr Fredia Sorrow has seen and examined pt Hemoptysis is almost bloody mucous- mild amt  Allergies: Penicillins; Shellfish allergy; and Olive oil  Medications: Prior to Admission medications   Medication Sig Start Date End Date Taking? Authorizing Provider  apixaban (ELIQUIS) 5 MG TABS tablet Take 5 mg by mouth 2 (two) times daily.   Yes Historical Provider, MD  aspirin 81 MG tablet Take 81 mg by mouth daily.   Yes Historical Provider, MD  atorvastatin (LIPITOR) 80 MG tablet Take 80 mg by mouth daily.   Yes Historical Provider, MD  bisoprolol-hydrochlorothiazide (ZIAC) 5-6.25 MG per tablet Take 1 tablet by mouth daily.   Yes Historical Provider, MD  doxycycline (VIBRA-TABS) 100 MG tablet Take 100 mg by mouth every morning. 02/26/14  Yes Historical Provider, MD  escitalopram (LEXAPRO) 10 MG tablet Take 10-30 mg by mouth 2 (two) times daily. 10mg  in the morning and 30mg  in the evening   Yes Historical Provider, MD  fluorometholone (FML FORTE) 0.25 % ophthalmic suspension Place 1 drop into both eyes 2 (two) times daily.    Yes Historical Provider, MD  memantine (NAMENDA) 10 MG tablet Take 10 mg by mouth daily.     Yes Historical Provider, MD  zonisamide (ZONEGRAN) 100 MG capsule Take 300 mg by mouth 2 (two) times daily.    Yes Historical Provider, MD  EPINEPHrine (EPIPEN 2-PAK) 0.3 mg/0.3 mL DEVI Use as directed     Historical Provider, MD  fish oil-omega-3 fatty acids 1000 MG capsule Take 1 g by mouth daily.      Historical Provider, MD    Review of Systems  Vital Signs: BP 162/78 mmHg  Pulse 69  Temp(Src) 98.2 F (36.8 C) (Oral)  Resp 24  Ht 5\' 3"  (1.6 m)  Wt 60.782 kg (134 lb)  BMI 23.74 kg/m2  SpO2 94%  Physical Exam  Constitutional:  Denies pain Denies Nausea  Pulmonary/Chest: Effort normal  and breath sounds normal. No respiratory distress. She has no wheezes. She has no rales.  Deep cough Bloody mucous output CXR pending  Nursing note and vitals reviewed.   Imaging: No results found.  Labs:  CBC:  Recent Labs  03/27/14 1519  WBC 5.9  HGB 12.4  HCT 37.8  PLT 217    COAGS:  Recent Labs  03/27/14 1519  INR 1.18  APTT 40*    BMP:  Recent Labs  03/27/14 1519  NA 143  K 4.2  CL 106  CO2 26  GLUCOSE 98  BUN 16  CALCIUM 9.1  CREATININE 0.91  GFRNONAA 70*  GFRAA 81*    LIVER FUNCTION TESTS:  Recent Labs  03/27/14 1519  BILITOT <0.2*  AST 26  ALT 30  ALKPHOS 81  PROT 6.8  ALBUMIN 4.0    Assessment and Plan:  RML pulm AVM embolization without complication in IR with Dr 14/09/15 today Mild hemoptysis cxr pending Pt NAD Admitting for overnight observation Close follow   I spent a total of 15 minutes face to face in clinical consultation/evaluation, greater than 50% of which was counseling/coordinating care for Pulm AVM embo  Signed: Atha Muradyan A 04/04/2014, 3:52 PM

## 2014-04-04 NOTE — Anesthesia Postprocedure Evaluation (Signed)
  Anesthesia Post-op Note  Patient: Priscilla Duke  Procedure(s) Performed: Procedure(s) with comments: RADIOLOGY WITH ANESTHESIA (N/A) - DR. YAMAGATA PERFORMING  Patient Location: PACU  Anesthesia Type:General  Level of Consciousness: awake, alert , oriented and patient cooperative  Airway and Oxygen Therapy: Patient Spontanous Breathing  Post-op Pain: none  Post-op Assessment: Post-op Vital signs reviewed, Patient's Cardiovascular Status Stable, Respiratory Function Stable, Patent Airway, No signs of Nausea or vomiting and Pain level controlled  Post-op Vital Signs: stable  Last Vitals:  Filed Vitals:   04/04/14 0910  BP: 138/78  Pulse: 55  Temp: 37.1 C  Resp: 20    Complications: No apparent anesthesia complications

## 2014-04-04 NOTE — Anesthesia Procedure Notes (Signed)
Procedure Name: Intubation Date/Time: 04/04/2014 11:52 AM Performed by: Carney Living Pre-anesthesia Checklist: Patient identified, Emergency Drugs available, Suction available, Patient being monitored and Timeout performed Patient Re-evaluated:Patient Re-evaluated prior to inductionOxygen Delivery Method: Circle system utilized Preoxygenation: Pre-oxygenation with 100% oxygen Intubation Type: IV induction Ventilation: Mask ventilation without difficulty Laryngoscope Size: Mac and 3 Grade View: Grade I Tube type: Oral Tube size: 7.0 mm Number of attempts: 1 Airway Equipment and Method: Stylet and LTA kit utilized Placement Confirmation: ETT inserted through vocal cords under direct vision,  positive ETCO2 and breath sounds checked- equal and bilateral Secured at: 20 cm Tube secured with: Tape Dental Injury: Teeth and Oropharynx as per pre-operative assessment

## 2014-04-04 NOTE — Discharge Instructions (Signed)

## 2014-04-04 NOTE — Anesthesia Preprocedure Evaluation (Addendum)
Anesthesia Evaluation  Patient identified by MRN, date of birth, ID band Patient awake    Reviewed: Allergy & Precautions, H&P , NPO status , Patient's Chart, lab work & pertinent test results  Airway Mallampati: II  TM Distance: >3 FB Neck ROM: Full    Dental  (+) Teeth Intact, Dental Advisory Given   Pulmonary former smoker,          Cardiovascular hypertension, Pt. on medications + Peripheral Vascular Disease     Neuro/Psych  Headaches, Seizures -, Well Controlled,  Anxiety Depression  Neuromuscular disease CVA    GI/Hepatic GERD-  Medicated and Controlled,  Endo/Other    Renal/GU      Musculoskeletal  (+) Arthritis -, Osteoarthritis,  Fibromyalgia -  Abdominal   Peds  Hematology   Anesthesia Other Findings Pulmonary AVM  Reproductive/Obstetrics                            Anesthesia Physical Anesthesia Plan  ASA: III  Anesthesia Plan: General   Post-op Pain Management:    Induction: Intravenous  Airway Management Planned: Oral ETT  Additional Equipment:   Intra-op Plan:   Post-operative Plan: Extubation in OR and Possible Post-op intubation/ventilation  Informed Consent: I have reviewed the patients History and Physical, chart, labs and discussed the procedure including the risks, benefits and alternatives for the proposed anesthesia with the patient or authorized representative who has indicated his/her understanding and acceptance.   Dental advisory given  Plan Discussed with: CRNA, Anesthesiologist and Surgeon  Anesthesia Plan Comments:        Anesthesia Quick Evaluation

## 2014-04-04 NOTE — Procedures (Signed)
Procedure:  Right pulmonary arteriography; transcatheter embolization of right middle lobe AVM Anesthesia:  General Findings:  Normal PA pressure of 29/12 mm Hg, mean 21 mm Hg. RML branch supplies high flow AVM/AVF of anterior RML.  Catheterized and embolized with 10 microcoils (6-8 mm Concerto coils). Additional placement of 20mm EV3 MVP (Micro Vascular Plug) in feeding vessel. AVM completely occluded. Plan:  PACU recovery followed by overnight observation.  Clinic follow up and CTA in 4-6 weeks.

## 2014-04-04 NOTE — H&P (Signed)
Chief Complaint: R middle lobe pulmonary arteriovenous malformation  Referring Physician(s): Yamagata,Glenn T  History of Present Illness: Priscilla Duke is a 53 y.o. female  Pt with Hx CVA 2006 Some residual speech difficulties Incidental finding on CT Abd 2014 for abd pain was RML AVM. Pt was asymptomatic and was consulted for embolization with Dr Miles Costain then. Recommended follow up--pt then suffered second CVA 12/2013 Work up for PFO was all negative AVM almost probable for cause of CVA Consulted recently with Dr Fredia Sorrow for embolization Now scheduled for same Last dose Eliquis 04/02/14  Past Medical History  Diagnosis Date  . 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 Didelphic uterus     Dr. Madelaine Etienne  . Colonic polyp   . Hypertension   . Arthritis   . Depression   . Seizures   . Hypercholesterolemia   . Memory impairment     due to stroke  . Weakness of right side of body   . PONV (postoperative nausea and vomiting)   . Shortness of breath dyspnea     with exertion  . Wears glasses   . Allergy     skin, scalp  . IBS (irritable bowel syndrome)   . Heart murmur     as a child   . Bronchitis, acute     seasonal  . Anxiety   . GERD (gastroesophageal reflux disease)   . Headache   . Fibromyalgia   . Stroke     August 2015; 2006  . Inguinal hernia     right     Past Surgical History  Procedure Laterality Date  . Vaginal hysterectomy  2003  . Hemorrhoid surgery    . Tummy tuck   05/2009    breast lift   . Colonoscopy w/ biopsies and polypectomy    . Breast surgery      breast lift and implants removed  . Anal fissure repair      due to tear  . Tubal ligation      Allergies: Penicillins; Shellfish allergy; and Olive oil  Medications: Prior to Admission medications   Medication Sig Start Date End Date Taking? Authorizing Provider  apixaban (ELIQUIS) 5 MG TABS tablet Take 5 mg by mouth 2 (two) times daily.    Historical Provider, MD    aspirin 81 MG tablet Take 81 mg by mouth daily.    Historical Provider, MD  atorvastatin (LIPITOR) 80 MG tablet Take 80 mg by mouth daily.    Historical Provider, MD  bisoprolol-hydrochlorothiazide [facility]) 5-6.25 MG per tablet Take 1 tablet by mouth daily.    Historical Provider, MD  doxycycline (VIBRA-TABS) 100 MG tablet Take 100 mg by mouth every morning. 02/26/14   Historical Provider, MD  EPINEPHrine (EPIPEN 2-PAK) 0.3 mg/0.3 mL DEVI Use as directed     Historical Provider, MD  escitalopram (LEXAPRO) 10 MG tablet Take 10-30 mg by mouth 2 (two) times daily. 10mg  in the morning and 30mg  in the evening    Historical Provider, MD  fish oil-omega-3 fatty acids 1000 MG capsule Take 1 g by mouth daily.      Historical Provider, MD  fluorometholone (FML FORTE) 0.25 % ophthalmic suspension Place 1 drop into both eyes 2 (two) times daily.     Historical Provider, MD  memantine (NAMENDA) 10 MG tablet Take 10 mg by mouth daily.      Historical Provider, MD  zonisamide (ZONEGRAN) 100 MG capsule Take 300 mg by mouth 2 (  two) times daily.     Historical Provider, MD    Family History  Problem Relation Age of Onset  . Aneurysm Sister   . Hypertension Other   . Hypertension Mother   . Stroke Mother   . Migraines Mother   . Seizures Father   . Asthma Brother     History   Social History  . Marital Status: Married    Spouse Name: N/A    Number of Children: 5  . Years of Education: N/A   Occupational History  . Disability, former speech pathologist    Social History Main Topics  . Smoking status: Former Smoker    Types: E-cigarettes    Quit date: 01/04/2014  . Smokeless tobacco: Never Used  . Alcohol Use: Yes     Comment: "a glass of wine daily'  . Drug Use: No  . Sexual Activity: None   Other Topics Concern  . None   Social History Narrative    Review of Systems: A 12 point ROS discussed and pertinent positives are indicated in the HPI above.  All other systems are  negative.  Review of Systems  Constitutional: Positive for fatigue. Negative for activity change and appetite change.  HENT: Negative for nosebleeds and sore throat.   Respiratory: Negative for cough, chest tightness, shortness of breath and wheezing.   Cardiovascular: Positive for chest pain.       Occasional pain Rt  Gastrointestinal: Negative for nausea, vomiting and abdominal pain.  Genitourinary: Negative for difficulty urinating.  Musculoskeletal: Negative for back pain and gait problem.  Neurological: Positive for speech difficulty. Negative for dizziness, tremors, seizures, syncope, facial asymmetry, weakness, light-headedness, numbness and headaches.  Psychiatric/Behavioral: Negative for behavioral problems and confusion.    Vital Signs: There were no vitals taken for this visit.  Physical Exam  Constitutional: She is oriented to person, place, and time. She appears well-developed and well-nourished.  Cardiovascular: Normal rate, regular rhythm and normal heart sounds.   No murmur heard. Pulmonary/Chest: Effort normal and breath sounds normal. No respiratory distress. She has no wheezes.  Abdominal: Soft. Bowel sounds are normal. There is no tenderness.  Musculoskeletal: Normal range of motion.  Neurological: She is alert and oriented to person, place, and time.  Skin: Skin is warm and dry.  Psychiatric: She has a normal mood and affect. Her behavior is normal. Judgment and thought content normal.  Nursing note and vitals reviewed.   Imaging: No results found.  Labs:  CBC:  Recent Labs  03/27/14 1519  WBC 5.9  HGB 12.4  HCT 37.8  PLT 217    COAGS:  Recent Labs  03/27/14 1519  INR 1.18  APTT 40*    BMP:  Recent Labs  03/27/14 1519  NA 143  K 4.2  CL 106  CO2 26  GLUCOSE 98  BUN 16  CALCIUM 9.1  CREATININE 0.91  GFRNONAA 70*  GFRAA 81*    LIVER FUNCTION TESTS:  Recent Labs  03/27/14 1519  BILITOT <0.2*  AST 26  ALT 30  ALKPHOS 81   PROT 6.8  ALBUMIN 4.0    TUMOR MARKERS: No results for input(s): AFPTM, CEA, CA199, CHROMGRNA in the last 8760 hours.  Assessment and Plan:  Rt pulmonary arteriovenous malformation Now scheduled for embolization in IR Pt and family aware of procedure benefits and risks and agreeable to proceed Consent signed andin chart Pt aware she will likely be admitted overnight for observation Plan dc in am She is agreeable  Thank you for this interesting consult.  I greatly enjoyed meeting Priscilla Duke and look forward to participating in their care.    I spent a total of 20 minutes face to face in clinical consultation, greater than 50% of which was counseling/coordinating care for RML Pulmonary AVM embolization  Signed: Random Dobrowski A 04/04/2014, 9:06 AM

## 2014-04-04 NOTE — Transfer of Care (Signed)
Immediate Anesthesia Transfer of Care Note  Patient: Priscilla Duke  Procedure(s) Performed: Procedure(s) with comments: RADIOLOGY WITH ANESTHESIA (N/A) - DR. YAMAGATA PERFORMING  Patient Location: PACU  Anesthesia Type:General  Level of Consciousness: awake, alert , oriented and patient cooperative  Airway & Oxygen Therapy: Patient Spontanous Breathing and Patient connected to nasal cannula oxygen  Post-op Assessment: Report given to PACU RN, Post -op Vital signs reviewed and stable and Patient moving all extremities X 4  Post vital signs: Reviewed and stable  Complications: No apparent anesthesia complications

## 2014-04-05 ENCOUNTER — Observation Stay (HOSPITAL_COMMUNITY): Payer: BC Managed Care – PPO

## 2014-04-05 ENCOUNTER — Encounter (HOSPITAL_COMMUNITY): Payer: Self-pay | Admitting: Interventional Radiology

## 2014-04-05 DIAGNOSIS — Q2572 Congenital pulmonary arteriovenous malformation: Secondary | ICD-10-CM | POA: Diagnosis not present

## 2014-04-05 LAB — CBC
HEMATOCRIT: 33.6 % — AB (ref 36.0–46.0)
HEMOGLOBIN: 10.8 g/dL — AB (ref 12.0–15.0)
MCH: 28.3 pg (ref 26.0–34.0)
MCHC: 32.1 g/dL (ref 30.0–36.0)
MCV: 88.2 fL (ref 78.0–100.0)
Platelets: 121 10*3/uL — ABNORMAL LOW (ref 150–400)
RBC: 3.81 MIL/uL — ABNORMAL LOW (ref 3.87–5.11)
RDW: 13.6 % (ref 11.5–15.5)
WBC: 6.2 10*3/uL (ref 4.0–10.5)

## 2014-04-05 MED ORDER — HYDROMORPHONE HCL 1 MG/ML IJ SOLN
1.0000 mg | INTRAMUSCULAR | Status: DC | PRN
  Administered 2014-04-05: 1 mg via INTRAVENOUS
  Filled 2014-04-05: qty 1

## 2014-04-05 NOTE — Discharge Summary (Signed)
Patient ID: Priscilla Duke MRN: [id number] DOB/AGE: [birth year] 55 y.o.  Admit date: 04/04/2014 Discharge date: 04/05/2014  Admission Diagnoses: Rt Pulmonary Arteriovenous malformation  Discharge Diagnoses: Treatment of Pulmonary AVM  Active Problems:  Rt Pulmonary Arteriovenous malformation                                 HTN                                 CVA                                 Colonic Polyps  Discharged Condition: improved; stable  Hospital Course: Pt with Hx CVA 2006 CT Abd was performed 02/2013 for abd pain; incidental finding of RML pulmonary AVM. Was consulted then with Dr Miles Costain for embolization. Recommended follow up--pt asymptomatic. Suffered new CVA 12/2013.  PFO negative work up. Consulted again with Dr Fredia Sorrow.  Scheduled for procedure and performed 12/17 /15. Pt did well with procedure. Did develop mild hemoptysis post procedure.  Resolved and admitted to floor for overnight observation.  Pt complained of some Rt chest pain overnight--responded to Vicodin and resolving nicely.  No pain since last pm.  Has had no hemoptysis since last night.  Does have occasional cough still. CXR this am is negative/clear. Dr Lowella Dandy has seen and examined pt. Urinating well; passing gas. Ambulating without assistance. Denies N/V Plan for discharge now I have seen and examined pt. DC Eliquis; continue ASA 81 mg. Pt understands all dc instructions. Will see Dr Fredia Sorrow in follow up 4 weeks.  Consults: None  Significant Diagnostic Studies: Pulmonary arteriogram  Treatments: Rt Pulmonary arteriovenous malformation embolization  Discharge Exam: Blood pressure 117/73, pulse 57, temperature 98 F (36.7 C), temperature source Oral, resp. rate 16, height 5\' 3"  (1.6 m), weight 60.782 kg (134 lb), SpO2 98 %.  PE:  A/O; pleasant NAD; appropriate Heart: RRR Abdomen: soft; NT Lungs: CTA Ext: FROM Rt groin NT; no bleeding; no hematoma Rt foot: 2+ pulses UOP good;  yellow CXR 12/18: neg  Results for orders placed or performed during the hospital encounter of 04/04/14  CBC  Result Value Ref Range   WBC 6.2 4.0 - 10.5 K/uL   RBC 3.81 (L) 3.87 - 5.11 MIL/uL   Hemoglobin 10.8 (L) 12.0 - 15.0 g/dL   HCT 04/06/14 (L) 42.5 - 95.6 %   MCV 88.2 78.0 - 100.0 fL   MCH 28.3 26.0 - 34.0 pg   MCHC 32.1 30.0 - 36.0 g/dL   RDW 38.7 56.4 - 33.2 %   Platelets 121 (L) 150 - 400 K/uL  POCT Activated clotting time  Result Value Ref Range   Activated Clotting Time 191 seconds     Disposition: RML pulmonary AVM embolization 04/04/14 in IR with Dr 04/06/14 and Dr Fredia Sorrow. Has done well overnight Plan for discharge now. Continue all home meds; DC Eliquis Pt will hear from clinic for time and date of 4 week follow up. Has good understanding of plan Vicodin 5/300 mg #20: Rx given  Discharge Instructions    Call MD for:  difficulty breathing, headache or visual disturbances    Complete by:  As directed      Call MD for:  extreme fatigue    Complete by:  As directed      Call MD for:  hives    Complete by:  As directed      Call MD for:  persistant dizziness or light-headedness    Complete by:  As directed      Call MD for:  persistant nausea and vomiting    Complete by:  As directed      Call MD for:  redness, tenderness, or signs of infection (pain, swelling, redness, odor or green/yellow discharge around incision site)    Complete by:  As directed      Call MD for:  severe uncontrolled pain    Complete by:  As directed      Call MD for:  temperature >100.4    Complete by:  As directed      Diet - low sodium heart healthy    Complete by:  As directed      Discharge instructions    Complete by:  As directed   DC Eliquis; Continue ASA 81 mg; 4 week follow up with Dr Fredia Sorrow- clinic will call pt with time and date; call [phone] if questions/concerns     Discharge wound care:    Complete by:  As directed   May shower today; may change Rt groin badage to  band aid after shower; keep fresh band aid on Rt groin x 1 week     Driving Restrictions    Complete by:  As directed   No driving x 5 days     Increase activity slowly    Complete by:  As directed      Lifting restrictions    Complete by:  As directed   No lifting over 10 lbs x 1 week            Medication List    STOP taking these medications        ELIQUIS 5 MG Tabs tablet  Generic drug:  apixaban      TAKE these medications        aspirin 81 MG tablet  Take 81 mg by mouth daily.     atorvastatin 80 MG tablet  Commonly known as:  LIPITOR  Take 80 mg by mouth daily.     bisoprolol-hydrochlorothiazide 5-6.25 MG per tablet  Commonly known as:  ZIAC  Take 1 tablet by mouth daily.     doxycycline 100 MG tablet  Commonly known as:  VIBRA-TABS  Take 100 mg by mouth every morning.     EPIPEN 2-PAK 0.3 mg/0.3 mL Devi  Generic drug:  EPINEPHrine  Use as directed     escitalopram 10 MG tablet  Commonly known as:  LEXAPRO  Take 10-30 mg by mouth 2 (two) times daily. 10mg  in the morning and 30mg  in the evening     fish oil-omega-3 fatty acids 1000 MG capsule  Take 1 g by mouth daily.     fluorometholone 0.25 % ophthalmic suspension  Commonly known as:  FML FORTE  Place 1 drop into both eyes 2 (two) times daily.     memantine 10 MG tablet  Commonly known as:  NAMENDA  Take 10 mg by mouth daily.     zonisamide 100 MG capsule  Commonly known as:  ZONEGRAN  Take 300 mg by mouth 2 (two) times daily.         Signed: [facility] 04/05/2014, 10:08 AM

## 2014-04-05 NOTE — Progress Notes (Signed)
Referring Physician(s): Dr Kenney Houseman  Subjective:  R Pulmonary AVM embolization 12/17 in IR with Dr Fredia Sorrow and Dr Corliss Skains Has done well Some complaint of occasional Rt chest pain overnight Resolves with meds None now; This pain is similar to what she has at home periodically Minimal hemoptysis; none since last night No other complaints  Allergies: Penicillins; Shellfish allergy; and Olive oil  Medications: Prior to Admission medications   Medication Sig Start Date End Date Taking? Authorizing Provider  apixaban (ELIQUIS) 5 MG TABS tablet Take 5 mg by mouth 2 (two) times daily.   Yes Historical Provider, MD  aspirin 81 MG tablet Take 81 mg by mouth daily.   Yes Historical Provider, MD  atorvastatin (LIPITOR) 80 MG tablet Take 80 mg by mouth daily.   Yes Historical Provider, MD  bisoprolol-hydrochlorothiazide (ZIAC) 5-6.25 MG per tablet Take 1 tablet by mouth daily.   Yes Historical Provider, MD  doxycycline (VIBRA-TABS) 100 MG tablet Take 100 mg by mouth every morning. 02/26/14  Yes Historical Provider, MD  escitalopram (LEXAPRO) 10 MG tablet Take 10-30 mg by mouth 2 (two) times daily.  in the morning and  in the evening   Yes Historical Provider, MD  fluorometholone (FML FORTE) 0.25 % ophthalmic suspension Place 1 drop into both eyes 2 (two) times daily.    Yes Historical Provider, MD  memantine (NAMENDA) 10 MG tablet Take 10 mg by mouth daily.     Yes Historical Provider, MD  zonisamide (ZONEGRAN) 100 MG capsule Take 300 mg by mouth 2 (two) times daily.    Yes Historical Provider, MD  EPINEPHrine (EPIPEN 2-PAK) 0.3 mg/0.3 mL DEVI Use as directed     Historical Provider, MD  fish oil-omega-3 fatty acids 1000 MG capsule Take 1 g by mouth daily.      Historical Provider, MD    Review of Systems  Vital Signs: BP 117/73 mmHg  Pulse 57  Temp(Src) 98 F (36.7 C) (Oral)  Resp 16  Ht  (1.6 m)  Wt 60.782 kg (134 lb)  BMI 23.74 kg/m2  SpO2 98%  Physical Exam    Pulmonary/Chest: Effort normal and breath sounds normal. She has no wheezes.  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 The patient presents for arteriographic characterization of the malformation transcatheter embolization.  EXAM: 1. ULTRASOUND GUIDANCE FOR VASCULAR ACCESS OF THE RIGHT COMMON FEMORAL VEIN 2. RIGHT PULMONARY ARTERIOGRAPHY AT THE LEVEL OF THE RIGHT MAIN PULMONARY ARTERY WITH HEMODYNAMIC PRESSURE MEASUREMENT 3. ADDITIONAL SELECTIVE ARTERIOGRAPHY AT THE LEVEL OF THE RIGHT MIDDLE LOBE PULMONARY ARTERY 4. ADDITIONAL SELECTIVE ARTERIOGRAPHY OF A RIGHT MIDDLE LOBE PULMONARY ARTERY BRANCH 5. 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 FOLLOW-UP ANGIOGRAPHY AFTER EMBOLIZATION ASSISTANT:  DR. TONY DEVESHWAR  MEDICATIONS: Anesthesia: General  1 g IV vancomycin. Vancomycin was given within two hours of incision. Vancomycin was given due to an antibiotic allergy.  4000 units intravenous heparin during the procedure.  CONTRAST:  OMNIPAQUE IOHEXOL 300 MG/ML  SOLN  FLUOROSCOPY TIME:  65 min and 12 seconds.  PROCEDURE: Prior to the procedure, informed consent was obtained from the patient. Details and risks of the procedure were discussed. The patient was placed under general anesthesia. A time-out procedure was performed.  The right groin was prepped with  Betadine and a sterile drape applied. Patency of the right common femoral vein was confirmed by ultrasound. Under direct ultrasound guidance, a 21 gauge needle was advanced into the vein. Access was secured with a micropuncture set. A 7 French sheath was placed over a guidewire.  And angled pigtail catheter was advanced over a guidewire through the inferior vena cava and into the right heart. The  catheter was further advanced into the right pulmonary artery. Pulmonary artery pressures were obtained.  Initial pulmonary arteriography was performed through the angled pigtail catheter at the level of the right main pulmonary artery segment. The pigtail catheter was removed over a guidewire. The short 7 French sheath was also removed. A 65 cm, 7 French Arrow sheath was advanced over a wire into the right pulmonary artery.  A 5 [place] JB 1 catheter was advanced into the right middle lobe pulmonary artery and selective arteriography performed. The catheter was further advanced into a third order right middle lobe pulmonary artery branch and selective arteriography performed.  A Rebar micro catheter was then advanced through the 5 French catheter and into a new right middle lobe AV malformation. Arteriography was performed. Transcatheter embolization was then performed utilizing Concerto microcoils advanced through the micro catheter. A total of 10 embolization coils were utilized ranging in diameter from 6 mm up to 8 mm.  Additional deployment was performed in the right middle lobe feeding arterial branch of a 3 mm MVP Micro Vascular Plug. After plug deployment, additional arteriography was performed through the 5 French catheter. Additional right pulmonary artery pressure measurements were also obtained through the sheath.  After ACT was checked, the sheath was removed and hemostasis obtained with manual compression.  COMPLICATIONS: None  FINDINGS: Right pulmonary arteriography demonstrates normal caliber arteries. There is prompt visualization of a tortuous arteriovenous malformation emanating from a right middle lobe pulmonary artery branch and coursing medially and anteriorly with fistulous and dilated connection noted between artery and vein and early venous drainage into an enlarged vein that promptly flows into the left atrium via the inferior pulmonary vein. Flow through the malformation was subjectively  high. Initial pulmonary artery pressure was normal and 29/12 mm mercury, mean 20.  The AVM is supplied by a single anterior right middle lobe branch measuring approximately 3 mm in diameter. Tortuous nidus is present anteriorly with communication with a single dominant draining vein. No other vascular malformations are identified in the right lung.  Transcatheter embolization was performed initially at the most dilated segment of the AVM just beyond the arterial portion of the malformation and just in the venous side of the malformation. After deployment of multiple microcoils, flow was slowed considerably in the malformation but some outflow into the draining vein remained. Once a more transversely oriented portion of the supplying artery was accessible after coil deployment, decision was made to deploy a a vascular plug to complete embolization.  After deployment of the MVP vascular plug, the AVM was completely occluded with no further antegrade flow remaining and no visualization of venous drainage. Postprocedural pulmonary artery pressures were stable and normal measuring 31/12 mm mercury, mean 21.  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 After catheter rising the supplying arterial branch in the right middle lobe, the AVM was successfully embolized and occluded utilizing micro coils followed by a vascular plug. The patient will be admitted for overnight observation following the procedure.   Electronically Signed   By: Sherrine Maples  Fredia Sorrow M.D.   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 The patient presents for arteriographic characterization of the malformation transcatheter embolization.  EXAM: 1. ULTRASOUND GUIDANCE FOR VASCULAR ACCESS OF THE RIGHT COMMON  FEMORAL VEIN 2. RIGHT PULMONARY ARTERIOGRAPHY AT THE LEVEL OF THE RIGHT MAIN PULMONARY ARTERY WITH HEMODYNAMIC PRESSURE MEASUREMENT 3. ADDITIONAL SELECTIVE ARTERIOGRAPHY AT THE LEVEL OF THE RIGHT MIDDLE LOBE PULMONARY ARTERY 4. ADDITIONAL SELECTIVE ARTERIOGRAPHY OF A RIGHT MIDDLE LOBE PULMONARY ARTERY BRANCH 5. 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 FOLLOW-UP ANGIOGRAPHY AFTER EMBOLIZATION ASSISTANT:  DR. TONY DEVESHWAR  MEDICATIONS: Anesthesia: General  1 g IV vancomycin. Vancomycin was given within two hours of incision. Vancomycin was given due to an antibiotic allergy.  4000 units intravenous heparin during the procedure.  CONTRAST:  OMNIPAQUE IOHEXOL 300 MG/ML  SOLN  FLUOROSCOPY TIME:  65 min and 12 seconds.  PROCEDURE: Prior to the procedure, informed consent was obtained from the patient. Details and risks of the procedure were discussed. The patient was placed under general anesthesia. A time-out procedure was performed.  The right groin was prepped with Betadine and a sterile drape applied. Patency of the right common femoral vein was confirmed by ultrasound. Under direct ultrasound guidance, a 21 gauge needle was advanced into the vein. Access was secured with a micropuncture set. A 7 French sheath was placed over a guidewire.  And angled pigtail catheter was advanced over a guidewire through the inferior vena cava and into the right heart. The catheter was further advanced into the right pulmonary artery. Pulmonary artery pressures were obtained.  Initial pulmonary arteriography was performed through the angled pigtail catheter at the level of the right main pulmonary artery segment. The pigtail catheter was removed over a guidewire. The short 7 French sheath was also removed. A 65 cm, 7 French Arrow sheath was advanced over a wire into the right pulmonary artery.  A 5 [place] JB 1 catheter was advanced into the right middle lobe pulmonary artery and  selective arteriography performed. The catheter was further advanced into a third order right middle lobe pulmonary artery branch and selective arteriography performed.  A Rebar micro catheter was then advanced through the 5 French catheter and into a new right middle lobe AV malformation. Arteriography was performed. Transcatheter embolization was then performed utilizing Concerto microcoils advanced through the micro catheter. A total of 10 embolization coils were utilized ranging in diameter from 6 mm up to 8 mm.  Additional deployment was performed in the right middle lobe feeding arterial branch of a 3 mm MVP Micro Vascular Plug. After plug deployment, additional arteriography was performed through the 5 French catheter. Additional right pulmonary artery pressure measurements were also obtained through the sheath.  After ACT was checked, the sheath was removed and hemostasis obtained with manual compression.  COMPLICATIONS: None  FINDINGS: Right pulmonary arteriography demonstrates normal caliber arteries. There is prompt visualization of a tortuous arteriovenous malformation emanating from a right middle lobe pulmonary artery branch and coursing medially and anteriorly with fistulous and dilated connection noted between artery and vein and early venous drainage into an enlarged vein that promptly flows into the left atrium via the inferior pulmonary vein. Flow through the malformation was subjectively high. Initial pulmonary artery pressure was normal and 29/12 mm mercury, mean 20.  The AVM is supplied by a single  anterior right middle lobe branch measuring approximately 3 mm in diameter. Tortuous nidus is present anteriorly with communication with a single dominant draining vein. No other vascular malformations are identified in the right lung.  Transcatheter embolization was performed initially at the most dilated segment of the AVM just beyond the arterial portion of the malformation and just in the venous  side of the malformation. After deployment of multiple microcoils, flow was slowed considerably in the malformation but some outflow into the draining vein remained. Once a more transversely oriented portion of the supplying artery was accessible after coil deployment, decision was made to deploy a a vascular plug to complete embolization.  After deployment of the MVP vascular plug, the AVM was completely occluded with no further antegrade flow remaining and no visualization of venous drainage. Postprocedural pulmonary artery pressures were stable and normal measuring 31/12 mm mercury, mean 21.  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 After catheter rising the supplying arterial branch in the right middle lobe, the AVM was successfully embolized and occluded utilizing micro coils followed by a vascular plug. The patient will be admitted for overnight observation following the procedure.   Electronically Signed   By: [name] M.D.   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 The patient presents for arteriographic characterization of the malformation transcatheter embolization.  EXAM: 1. ULTRASOUND GUIDANCE FOR VASCULAR ACCESS OF THE RIGHT COMMON FEMORAL VEIN 2. RIGHT PULMONARY ARTERIOGRAPHY AT THE LEVEL OF THE RIGHT MAIN PULMONARY ARTERY WITH HEMODYNAMIC PRESSURE MEASUREMENT 3. ADDITIONAL SELECTIVE ARTERIOGRAPHY AT THE LEVEL OF THE RIGHT MIDDLE LOBE PULMONARY ARTERY 4. ADDITIONAL SELECTIVE ARTERIOGRAPHY OF A RIGHT MIDDLE LOBE PULMONARY ARTERY BRANCH 5. 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 FOLLOW-UP ANGIOGRAPHY AFTER EMBOLIZATION ASSISTANT:  DR. TONY DEVESHWAR  MEDICATIONS: Anesthesia: General  1 g IV  vancomycin. Vancomycin was given within two hours of incision. Vancomycin was given due to an antibiotic allergy.  4000 units intravenous heparin during the procedure.  CONTRAST:  OMNIPAQUE IOHEXOL 300 MG/ML  SOLN  FLUOROSCOPY TIME:  65 min and 12 seconds.  PROCEDURE: Prior to the procedure, informed consent was obtained from the patient. Details and risks of the procedure were discussed. The patient was placed under general anesthesia. A time-out procedure was performed.  The right groin was prepped with Betadine and a sterile drape applied. Patency of the right common femoral vein was confirmed by ultrasound. Under direct ultrasound guidance, a 21 gauge needle was advanced into the vein. Access was secured with a micropuncture set. A 7 French sheath was placed over a guidewire.  And angled pigtail catheter was advanced over a guidewire through the inferior vena cava and into the right heart. The catheter was further advanced into the right pulmonary artery. Pulmonary artery pressures were obtained.  Initial pulmonary arteriography was performed through the angled pigtail catheter at the level of the right main pulmonary artery segment. The pigtail catheter was removed over a guidewire. The short 7 French sheath was also removed. A 65 cm, 7 French Arrow sheath was advanced over a wire into the right pulmonary artery.  A 5 [place] JB 1 catheter was advanced into the right middle lobe pulmonary artery and selective arteriography performed. The catheter was further advanced into a third order right  middle lobe pulmonary artery branch and selective arteriography performed.  A Rebar micro catheter was then advanced through the 5 French catheter and into a new right middle lobe AV malformation. Arteriography was performed. Transcatheter embolization was then performed utilizing Concerto microcoils advanced through the micro catheter. A total of 10 embolization coils were utilized ranging in diameter from 6 mm up to  8 mm.  Additional deployment was performed in the right middle lobe feeding arterial branch of a 3 mm MVP Micro Vascular Plug. After plug deployment, additional arteriography was performed through the 5 French catheter. Additional right pulmonary artery pressure measurements were also obtained through the sheath.  After ACT was checked, the sheath was removed and hemostasis obtained with manual compression.  COMPLICATIONS: None  FINDINGS: Right pulmonary arteriography demonstrates normal caliber arteries. There is prompt visualization of a tortuous arteriovenous malformation emanating from a right middle lobe pulmonary artery branch and coursing medially and anteriorly with fistulous and dilated connection noted between artery and vein and early venous drainage into an enlarged vein that promptly flows into the left atrium via the inferior pulmonary vein. Flow through the malformation was subjectively high. Initial pulmonary artery pressure was normal and 29/12 mm mercury, mean 20.  The AVM is supplied by a single anterior right middle lobe branch measuring approximately 3 mm in diameter. Tortuous nidus is present anteriorly with communication with a single dominant draining vein. No other vascular malformations are identified in the right lung.  Transcatheter embolization was performed initially at the most dilated segment of the AVM just beyond the arterial portion of the malformation and just in the venous side of the malformation. After deployment of multiple microcoils, flow was slowed considerably in the malformation but some outflow into the draining vein remained. Once a more transversely oriented portion of the supplying artery was accessible after coil deployment, decision was made to deploy a a vascular plug to complete embolization.  After deployment of the MVP vascular plug, the AVM was completely occluded with no further antegrade flow remaining and no visualization of venous drainage. Postprocedural  pulmonary artery pressures were stable and normal measuring 31/12 mm mercury, mean 21.  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 After catheter rising the supplying arterial branch in the right middle lobe, the AVM was successfully embolized and occluded utilizing micro coils followed by a vascular plug. The patient will be admitted for overnight observation following the procedure.   Electronically Signed   By: [name] M.D.   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 The patient presents for arteriographic characterization of the malformation transcatheter embolization.  EXAM: 1. ULTRASOUND GUIDANCE FOR VASCULAR ACCESS OF THE RIGHT COMMON FEMORAL VEIN 2. RIGHT PULMONARY ARTERIOGRAPHY AT THE LEVEL OF THE RIGHT MAIN PULMONARY ARTERY WITH HEMODYNAMIC PRESSURE MEASUREMENT 3. ADDITIONAL SELECTIVE ARTERIOGRAPHY AT THE LEVEL OF THE RIGHT MIDDLE LOBE PULMONARY ARTERY 4. ADDITIONAL SELECTIVE ARTERIOGRAPHY OF A RIGHT MIDDLE LOBE PULMONARY ARTERY BRANCH 5. 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 FOLLOW-UP ANGIOGRAPHY AFTER EMBOLIZATION ASSISTANT:  DR. TONY DEVESHWAR  MEDICATIONS: Anesthesia: General  1 g IV vancomycin. Vancomycin was given within two hours of incision. Vancomycin was given due to an antibiotic allergy.  4000 units intravenous heparin during the procedure.  CONTRAST:  OMNIPAQUE IOHEXOL 300 MG/ML  SOLN  FLUOROSCOPY TIME:  65 min and 12 seconds.  PROCEDURE: Prior to the procedure, informed consent was obtained from the patient. Details and risks of the procedure were discussed. The patient was placed under general anesthesia. A time-out procedure was performed.  The right groin was prepped with Betadine and a sterile  drape applied. Patency of the right common femoral vein was confirmed by ultrasound. Under direct ultrasound guidance, a 21 gauge needle was advanced into the vein. Access was secured with a micropuncture set. A 7 French sheath was placed over a guidewire.  And angled pigtail catheter was advanced over a guidewire through the inferior vena cava and into the right heart. The catheter was further advanced into the right pulmonary artery. Pulmonary artery pressures were obtained.  Initial pulmonary arteriography was performed through the angled pigtail catheter at the level of the right main pulmonary artery segment. The pigtail catheter was removed over a guidewire. The short 7 French sheath was also removed. A 65 cm, 7 French Arrow sheath was advanced over a wire into the right pulmonary artery.  A 5 [place] JB 1 catheter was advanced into the right middle lobe pulmonary artery and selective arteriography performed. The catheter was further advanced into a third order right middle lobe pulmonary artery branch and selective arteriography performed.  A Rebar micro catheter was then advanced through the 5 French catheter and into a new right middle lobe AV malformation. Arteriography was performed. Transcatheter embolization was then performed utilizing Concerto microcoils advanced through the micro catheter. A total of 10 embolization coils were utilized ranging in diameter from 6 mm up to 8 mm.  Additional deployment was performed in the right middle lobe feeding arterial branch of a 3 mm MVP Micro Vascular Plug. After plug deployment, additional arteriography was performed through the 5 French catheter. Additional right pulmonary artery pressure measurements were also obtained through the sheath.  After ACT was checked, the sheath was removed and hemostasis obtained with manual compression.  COMPLICATIONS: None  FINDINGS: Right pulmonary arteriography demonstrates normal caliber arteries. There is prompt visualization  of a tortuous arteriovenous malformation emanating from a right middle lobe pulmonary artery branch and coursing medially and anteriorly with fistulous and dilated connection noted between artery and vein and early venous drainage into an enlarged vein that promptly flows into the left atrium via the inferior pulmonary vein. Flow through the malformation was subjectively high. Initial pulmonary artery pressure was normal and 29/12 mm mercury, mean 20.  The AVM is supplied by a single anterior right middle lobe branch measuring approximately 3 mm in diameter. Tortuous nidus is present anteriorly with communication with a single dominant draining vein. No other vascular malformations are identified in the right lung.  Transcatheter embolization was performed initially at the most dilated segment of the AVM just beyond the arterial portion of the malformation and just in the venous side of the malformation. After deployment of multiple microcoils, flow was slowed considerably in the malformation but some outflow into the draining vein remained. Once a more transversely oriented portion of the supplying artery was accessible after coil deployment, decision was made to deploy a a vascular plug to complete embolization.  After deployment of the MVP vascular plug, the AVM was completely occluded with no further antegrade flow remaining and no visualization of venous drainage. Postprocedural pulmonary artery pressures were stable and normal measuring 31/12 mm mercury, mean 21.  IMPRESSION: Successful characterization and transcatheter embolization of an isolated high flow pulmonary AVM in  the right middle lobe. After catheter rising the supplying arterial branch in the right middle lobe, the AVM was successfully embolized and occluded utilizing micro coils followed by a vascular plug. The patient will be admitted for overnight observation following the procedure.   Electronically Signed   By: [name] M.D.   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 The patient presents for arteriographic characterization of the malformation transcatheter embolization.  EXAM: 1. ULTRASOUND GUIDANCE FOR VASCULAR ACCESS OF THE RIGHT COMMON FEMORAL VEIN 2. RIGHT PULMONARY ARTERIOGRAPHY AT THE LEVEL OF THE RIGHT MAIN PULMONARY ARTERY WITH HEMODYNAMIC PRESSURE MEASUREMENT 3. ADDITIONAL SELECTIVE ARTERIOGRAPHY AT THE LEVEL OF THE RIGHT MIDDLE LOBE PULMONARY ARTERY 4. ADDITIONAL SELECTIVE ARTERIOGRAPHY OF A RIGHT MIDDLE LOBE PULMONARY ARTERY BRANCH 5. 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 FOLLOW-UP ANGIOGRAPHY AFTER EMBOLIZATION ASSISTANT:  DR. TONY DEVESHWAR  MEDICATIONS: Anesthesia: General  1 g IV vancomycin. Vancomycin was given within two hours of incision. Vancomycin was given due to an antibiotic allergy.  4000 units intravenous heparin during the procedure.  CONTRAST:  OMNIPAQUE IOHEXOL 300 MG/ML  SOLN  FLUOROSCOPY TIME:  65 min and 12 seconds.  PROCEDURE: Prior to the procedure, informed consent was obtained from the patient. Details and risks of the procedure were discussed. The patient was placed under general anesthesia. A time-out procedure was performed.  The right groin was prepped with Betadine and a sterile drape applied. Patency of the right common femoral vein was confirmed by ultrasound. Under direct ultrasound guidance, a 21 gauge needle was advanced into the vein. Access was secured with a micropuncture set. A 7 French sheath was placed over a guidewire.  And angled pigtail catheter was advanced over a guidewire through the inferior vena cava and into the right heart. The catheter was further advanced into the right pulmonary artery. Pulmonary artery pressures were obtained.  Initial pulmonary arteriography was performed through  the angled pigtail catheter at the level of the right main pulmonary artery segment. The pigtail catheter was removed over a guidewire. The short 7 French sheath was also removed. A 65 cm, 7 French Arrow sheath was advanced over a wire into the right pulmonary artery.  A 5 [place] JB 1 catheter was advanced into the right middle lobe pulmonary artery and selective arteriography performed. The catheter was further advanced into a third order right middle lobe pulmonary artery branch and selective arteriography performed.  A Rebar micro catheter was then advanced through the 5 French catheter and into a new right middle lobe AV malformation. Arteriography was performed. Transcatheter embolization was then performed utilizing Concerto microcoils advanced through the micro catheter. A total of 10 embolization coils were utilized ranging in diameter from 6 mm up to 8 mm.  Additional deployment was performed in the right middle lobe feeding arterial branch of a 3 mm MVP Micro Vascular Plug. After plug deployment, additional arteriography was performed through the 5 French catheter. Additional right pulmonary artery pressure measurements were also obtained through the sheath.  After ACT was checked, the sheath was removed and hemostasis obtained with manual compression.  COMPLICATIONS: None  FINDINGS: Right pulmonary arteriography demonstrates normal caliber arteries. There is prompt visualization of a tortuous arteriovenous malformation emanating from a right middle lobe pulmonary artery branch and coursing medially and anteriorly with fistulous and dilated connection noted  between artery and vein and early venous drainage into an enlarged vein that promptly flows into the left atrium via the inferior pulmonary vein. Flow through the malformation was subjectively high. Initial pulmonary artery pressure was normal and 29/12 mm mercury, mean 20.  The AVM is supplied by a single anterior right middle lobe branch measuring  approximately 3 mm in diameter. Tortuous nidus is present anteriorly with communication with a single dominant draining vein. No other vascular malformations are identified in the right lung.  Transcatheter embolization was performed initially at the most dilated segment of the AVM just beyond the arterial portion of the malformation and just in the venous side of the malformation. After deployment of multiple microcoils, flow was slowed considerably in the malformation but some outflow into the draining vein remained. Once a more transversely oriented portion of the supplying artery was accessible after coil deployment, decision was made to deploy a a vascular plug to complete embolization.  After deployment of the MVP vascular plug, the AVM was completely occluded with no further antegrade flow remaining and no visualization of venous drainage. Postprocedural pulmonary artery pressures were stable and normal measuring 31/12 mm mercury, mean 21.  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 After catheter rising the supplying arterial branch in the right middle lobe, the AVM was successfully embolized and occluded utilizing micro coils followed by a vascular plug. The patient will be admitted for overnight observation following the procedure.   Electronically Signed   By: [name] M.D.   On: 04/04/2014 17:07   Dg Chest Port 1 View  04/05/2014   CLINICAL DATA:  Hemoptysis  EXAM: PORTABLE CHEST - 1 VIEW  COMPARISON:  04/04/2014  FINDINGS: Vascular coils are again noted within the right middle lobe. The heart size and mediastinal contours are within normal limits. Both lungs are clear. The visualized skeletal structures are unremarkable.  IMPRESSION: 1. No active cardiopulmonary abnormalities.   Electronically Signed   By: Signa Kell M.D.   On: 04/05/2014 08:30   Dg Chest Port 1 View  04/04/2014   CLINICAL  DATA:  Hemoptysis.  Post embolization.  EXAM: PORTABLE CHEST - 1 VIEW  COMPARISON:  Chest CT 03/14/2013  FINDINGS: Post coiling of right middle lobe AVM. The heart size and mediastinal contours are within normal limits. Both lungs are clear. No pneumothorax or pleural effusion. The visualized skeletal structures are unremarkable.  IMPRESSION: Post coiling of right middle lobe AVM.  No acute pulmonary process.   Electronically Signed   By: Rubye Oaks M.D.   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 The patient presents for arteriographic characterization of the malformation transcatheter embolization.  EXAM: 1. ULTRASOUND GUIDANCE FOR VASCULAR ACCESS OF THE RIGHT COMMON FEMORAL VEIN 2. RIGHT PULMONARY ARTERIOGRAPHY AT THE LEVEL OF THE RIGHT MAIN PULMONARY ARTERY WITH HEMODYNAMIC PRESSURE MEASUREMENT 3. ADDITIONAL SELECTIVE ARTERIOGRAPHY AT THE LEVEL OF THE RIGHT MIDDLE LOBE PULMONARY ARTERY 4. ADDITIONAL SELECTIVE ARTERIOGRAPHY OF A RIGHT MIDDLE LOBE PULMONARY ARTERY BRANCH 5. 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 FOLLOW-UP ANGIOGRAPHY AFTER EMBOLIZATION ASSISTANT:  DR. TONY DEVESHWAR  MEDICATIONS: Anesthesia: General  1 g IV vancomycin. Vancomycin was given within two hours of incision. Vancomycin was given due to an antibiotic allergy.  4000 units intravenous heparin during the procedure.  CONTRAST:  OMNIPAQUE IOHEXOL 300 MG/ML  SOLN  FLUOROSCOPY TIME:  65 min and 12 seconds.  PROCEDURE: Prior to the procedure, informed consent was obtained from the patient. Details and risks of the procedure were discussed. The patient was placed under general anesthesia. A time-out procedure was performed.  The right groin was prepped with Betadine and a sterile drape applied. Patency of the right  common femoral vein was confirmed by ultrasound. Under direct ultrasound guidance, a 21 gauge needle was advanced into the vein. Access was secured with a micropuncture set. A 7 French sheath was placed over a guidewire.  And angled pigtail catheter was advanced over a guidewire through the inferior vena cava and into the right heart. The catheter was further advanced into the right pulmonary artery. Pulmonary artery pressures were obtained.  Initial pulmonary arteriography was performed through the angled pigtail catheter at the level of the right main pulmonary artery segment. The pigtail catheter was removed over a guidewire. The short 7 French sheath was also removed. A 65 cm, 7 French Arrow sheath was advanced over a wire into the right pulmonary artery.  A 5 [place] JB 1 catheter was advanced into the right middle lobe pulmonary artery and selective arteriography performed. The catheter was further advanced into a third order right middle lobe pulmonary artery branch and selective arteriography performed.  A Rebar micro catheter was then advanced through the 5 French catheter and into a new right middle lobe AV malformation. Arteriography was performed. Transcatheter embolization was then performed utilizing Concerto microcoils advanced through the micro catheter. A total of 10 embolization coils were utilized ranging in diameter from 6 mm up to 8 mm.  Additional deployment was performed in the right middle lobe feeding arterial branch of a 3 mm MVP Micro Vascular Plug. After plug deployment, additional arteriography was performed through the 5 French catheter. Additional right pulmonary artery pressure measurements were also obtained through the sheath.  After ACT was checked, the sheath was removed and hemostasis obtained with manual compression.  COMPLICATIONS: None  FINDINGS: Right pulmonary arteriography demonstrates normal caliber arteries. There is prompt visualization of a tortuous arteriovenous  malformation emanating from a right middle lobe pulmonary artery branch and coursing medially and anteriorly with fistulous and dilated connection noted between artery and vein and early venous drainage into an enlarged vein that promptly flows into the left atrium via the inferior pulmonary vein. Flow through the malformation was subjectively high. Initial pulmonary artery pressure was normal and 29/12 mm mercury, mean 20.  The AVM is supplied by a single anterior right middle lobe branch measuring approximately 3 mm in diameter. Tortuous nidus is present anteriorly with communication with a single dominant draining vein. No other vascular malformations are identified in the right lung.  Transcatheter embolization was performed initially at the most dilated segment of the AVM just beyond the arterial portion of the malformation and just in the venous side of the malformation. After deployment of multiple microcoils, flow was slowed considerably in the malformation but some outflow into the draining vein remained. Once a more transversely oriented portion of the supplying artery was accessible after coil deployment, decision was made to deploy a a vascular plug to complete embolization.  After deployment of the MVP vascular plug, the AVM was completely occluded with no further antegrade flow remaining and no visualization of venous drainage. Postprocedural pulmonary artery pressures were stable and normal measuring 31/12 mm mercury, mean  21.  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 After catheter rising the supplying arterial branch in the right middle lobe, the AVM was successfully embolized and occluded utilizing micro coils followed by a vascular plug. The patient will be admitted for overnight observation following the procedure.   Electronically Signed   By: [name] M.D.   On: 04/04/2014 17:07     Labs:  CBC:  Recent Labs  03/27/14 1519 04/05/14 0424  WBC 5.9 6.2  HGB 12.4 10.8*  HCT 37.8 33.6*  PLT 217 121*    COAGS:  Recent Labs  03/27/14 1519  INR 1.18  APTT 40*    BMP:  Recent Labs  03/27/14 1519  NA 143  K 4.2  CL 106  CO2 26  GLUCOSE 98  BUN 16  CALCIUM 9.1  CREATININE 0.91  GFRNONAA 70*  GFRAA 81*    LIVER FUNCTION TESTS:  Recent Labs  03/27/14 1519  BILITOT <0.2*  AST 26  ALT 30  ALKPHOS 81  PROT 6.8  ALBUMIN 4.0    Assessment and Plan:  Rt Pulm AVM embolization 12/17 in IR Doing well this am CXR neg 12/18 Plan for dc this am Dr Lowella Dandy to see before dc     I spent a total of 15 minutes face to face in clinical consultation/evaluation, greater than 50% of which was counseling/coordinating care for R pulm avm embo  Signed: Perl Folmar A 04/05/2014, 8:47 AM

## 2014-04-05 NOTE — Progress Notes (Signed)
I was called to Pt's room because pt was complaining of chest pain, numbness in her Left 2nd and 3rd fingers, with some pain that was going down her Right arm. Vitals and EKG was done. Vitals where stable BP=145/85, 65,100/RA, RR=21. EKG showed NSR. Dr. Fredia Sorrow was page about Pt's condition. He order 1mg  IV Dilaudid PRN for pain. RN will continue to monitor pt for any worsening changes.

## 2014-04-05 NOTE — Progress Notes (Signed)
Priscilla Duke to be D/C'd Home per MD order.  Discussed with the patient and all questions fully answered.  VSS, Skin clean, dry and intact without evidence of skin break down, no evidence of skin tears noted. IV catheter discontinued intact. Site without signs and symptoms of complications. Dressing and pressure applied.  An After Visit Summary was printed and given to the patient. Patient received prescription.  D/c education completed with patient/family including follow up instructions, medication list, d/c activities limitations if indicated, with other d/c instructions as indicated by MD - patient able to verbalize understanding, all questions fully answered.   Patient instructed to return to ED, call 911, or call MD for any changes in condition.   Patient escorted via WC, and D/C home via private auto.  Burt Ek 04/05/2014 10:05 AM

## 2014-04-05 NOTE — Progress Notes (Signed)
Called per floor RN for pt complaining of severe chest pain radiating to right arm, dizziness and numbness of 3rd and 4th left hand digits. Pt also complaining of spitting up blood. Patient post op 12/17 RML pulmonary AVM embolization per Dr.Yamagata. Post op in PACU she also had mild hemoptysis. Per patient she has experienced similar pain before the procedure and rates her pain 4/10. Upon my arrival pt denies numbness in left hand digits. EKG completed yielding NSR. VSS. Dr.Yamagata paged and updated on pt complaints per floor RN. Dilaudid IV ordered for tonight, no further orderd Pt reassured that some pain after this procedure is expected. Educated on using call bell to alert Nursing for worsening or change in pain. RN to monitor closely and notify provider and myself for worsening changes.

## 2014-04-07 ENCOUNTER — Emergency Department (HOSPITAL_COMMUNITY): Payer: BC Managed Care – PPO

## 2014-04-07 ENCOUNTER — Encounter (HOSPITAL_COMMUNITY): Payer: Self-pay | Admitting: Emergency Medicine

## 2014-04-07 ENCOUNTER — Observation Stay (HOSPITAL_COMMUNITY)
Admission: EM | Admit: 2014-04-07 | Discharge: 2014-04-08 | Disposition: A | Discharge status: Home / Self Care | Payer: BC Managed Care – PPO | Attending: Internal Medicine | Admitting: Internal Medicine

## 2014-04-07 DIAGNOSIS — Z87891 Personal history of nicotine dependence: Secondary | ICD-10-CM | POA: Insufficient documentation

## 2014-04-07 DIAGNOSIS — M069 Rheumatoid arthritis, unspecified: Secondary | ICD-10-CM

## 2014-04-07 DIAGNOSIS — F419 Anxiety disorder, unspecified: Secondary | ICD-10-CM | POA: Insufficient documentation

## 2014-04-07 DIAGNOSIS — F329 Major depressive disorder, single episode, unspecified: Secondary | ICD-10-CM | POA: Diagnosis not present

## 2014-04-07 DIAGNOSIS — K5900 Constipation, unspecified: Secondary | ICD-10-CM

## 2014-04-07 DIAGNOSIS — K589 Irritable bowel syndrome without diarrhea: Secondary | ICD-10-CM | POA: Insufficient documentation

## 2014-04-07 DIAGNOSIS — Z7982 Long term (current) use of aspirin: Secondary | ICD-10-CM | POA: Insufficient documentation

## 2014-04-07 DIAGNOSIS — Z8673 Personal history of transient ischemic attack (TIA), and cerebral infarction without residual deficits: Secondary | ICD-10-CM

## 2014-04-07 DIAGNOSIS — F4481 Dissociative identity disorder: Secondary | ICD-10-CM | POA: Insufficient documentation

## 2014-04-07 DIAGNOSIS — I2699 Other pulmonary embolism without acute cor pulmonale: Secondary | ICD-10-CM | POA: Diagnosis not present

## 2014-04-07 DIAGNOSIS — Z91013 Allergy to seafood: Secondary | ICD-10-CM | POA: Diagnosis not present

## 2014-04-07 DIAGNOSIS — E780 Pure hypercholesterolemia: Secondary | ICD-10-CM | POA: Diagnosis not present

## 2014-04-07 DIAGNOSIS — R042 Hemoptysis: Secondary | ICD-10-CM | POA: Diagnosis not present

## 2014-04-07 DIAGNOSIS — R569 Unspecified convulsions: Secondary | ICD-10-CM | POA: Diagnosis not present

## 2014-04-07 DIAGNOSIS — Z880 Allergy status to penicillin: Secondary | ICD-10-CM | POA: Insufficient documentation

## 2014-04-07 DIAGNOSIS — R071 Chest pain on breathing: Secondary | ICD-10-CM

## 2014-04-07 DIAGNOSIS — Q2572 Congenital pulmonary arteriovenous malformation: Secondary | ICD-10-CM | POA: Diagnosis present

## 2014-04-07 DIAGNOSIS — I10 Essential (primary) hypertension: Secondary | ICD-10-CM | POA: Diagnosis present

## 2014-04-07 DIAGNOSIS — E785 Hyperlipidemia, unspecified: Secondary | ICD-10-CM

## 2014-04-07 DIAGNOSIS — R0789 Other chest pain: Secondary | ICD-10-CM | POA: Diagnosis not present

## 2014-04-07 DIAGNOSIS — Z888 Allergy status to other drugs, medicaments and biological substances status: Secondary | ICD-10-CM | POA: Insufficient documentation

## 2014-04-07 DIAGNOSIS — K219 Gastro-esophageal reflux disease without esophagitis: Secondary | ICD-10-CM | POA: Insufficient documentation

## 2014-04-07 DIAGNOSIS — R0781 Pleurodynia: Secondary | ICD-10-CM | POA: Diagnosis not present

## 2014-04-07 DIAGNOSIS — R079 Chest pain, unspecified: Secondary | ICD-10-CM | POA: Diagnosis present

## 2014-04-07 DIAGNOSIS — Z79899 Other long term (current) drug therapy: Secondary | ICD-10-CM | POA: Insufficient documentation

## 2014-04-07 LAB — COMPREHENSIVE METABOLIC PANEL
ALT: 32 U/L (ref 0–35)
ANION GAP: 12 (ref 5–15)
AST: 27 U/L (ref 0–37)
Albumin: 3.4 g/dL — ABNORMAL LOW (ref 3.5–5.2)
Alkaline Phosphatase: 94 U/L (ref 39–117)
BUN: 14 mg/dL (ref 6–23)
CALCIUM: 9.2 mg/dL (ref 8.4–10.5)
CO2: 26 meq/L (ref 19–32)
CREATININE: 0.84 mg/dL (ref 0.50–1.10)
Chloride: 102 mEq/L (ref 96–112)
GFR calc Af Amer: 89 mL/min — ABNORMAL LOW (ref >90)
GFR, EST NON AFRICAN AMERICAN: 77 mL/min — AB (ref >90)
Glucose, Bld: 102 mg/dL — ABNORMAL HIGH (ref 70–99)
Potassium: 4 mEq/L (ref 3.7–5.3)
Sodium: 140 mEq/L (ref 137–147)
TOTAL PROTEIN: 6.3 g/dL (ref 6.0–8.3)
Total Bilirubin: <0.2 mg/dL — ABNORMAL LOW (ref 0.3–1.2)

## 2014-04-07 LAB — CBC WITH DIFFERENTIAL/PLATELET
Basophils Absolute: 0 10*3/uL (ref 0.0–0.1)
Basophils Relative: 0 % (ref 0–1)
EOS ABS: 0.2 10*3/uL (ref 0.0–0.7)
Eosinophils Relative: 5 % (ref 0–5)
HEMATOCRIT: 33.2 % — AB (ref 36.0–46.0)
Hemoglobin: 10.5 g/dL — ABNORMAL LOW (ref 12.0–15.0)
LYMPHS ABS: 1.8 10*3/uL (ref 0.7–4.0)
LYMPHS PCT: 36 % (ref 12–46)
MCH: 27.7 pg (ref 26.0–34.0)
MCHC: 31.6 g/dL (ref 30.0–36.0)
MCV: 87.6 fL (ref 78.0–100.0)
MONO ABS: 0.5 10*3/uL (ref 0.1–1.0)
MONOS PCT: 10 % (ref 3–12)
Neutro Abs: 2.5 10*3/uL (ref 1.7–7.7)
Neutrophils Relative %: 49 % (ref 43–77)
PLATELETS: 173 10*3/uL (ref 150–400)
RBC: 3.79 MIL/uL — AB (ref 3.87–5.11)
RDW: 13.6 % (ref 11.5–15.5)
WBC: 5 10*3/uL (ref 4.0–10.5)

## 2014-04-07 LAB — URINALYSIS, ROUTINE W REFLEX MICROSCOPIC
Bilirubin Urine: NEGATIVE
GLUCOSE, UA: NEGATIVE mg/dL
Hgb urine dipstick: NEGATIVE
KETONES UR: NEGATIVE mg/dL
Leukocytes, UA: NEGATIVE
NITRITE: NEGATIVE
PROTEIN: NEGATIVE mg/dL
Specific Gravity, Urine: 1.02 (ref 1.005–1.030)
Urobilinogen, UA: 0.2 mg/dL (ref 0.0–1.0)
pH: 7.5 (ref 5.0–8.0)

## 2014-04-07 LAB — I-STAT CHEM 8, ED
BUN: 15 mg/dL (ref 6–23)
CALCIUM ION: 1.22 mmol/L (ref 1.12–1.23)
Chloride: 102 mEq/L (ref 96–112)
Creatinine, Ser: 0.9 mg/dL (ref 0.50–1.10)
GLUCOSE: 106 mg/dL — AB (ref 70–99)
HEMATOCRIT: 32 % — AB (ref 36.0–46.0)
Hemoglobin: 10.9 g/dL — ABNORMAL LOW (ref 12.0–15.0)
Potassium: 3.9 mEq/L (ref 3.7–5.3)
Sodium: 140 mEq/L (ref 137–147)
TCO2: 22 mmol/L (ref 0–100)

## 2014-04-07 LAB — I-STAT TROPONIN, ED: TROPONIN I, POC: 0 ng/mL (ref 0.00–0.08)

## 2014-04-07 LAB — PREGNANCY, URINE: PREG TEST UR: NEGATIVE

## 2014-04-07 LAB — LIPASE, BLOOD: Lipase: 17 U/L (ref 11–59)

## 2014-04-07 MED ORDER — ATORVASTATIN CALCIUM 80 MG PO TABS
80.0000 mg | ORAL_TABLET | Freq: Every day | ORAL | Status: DC
  Administered 2014-04-07 – 2014-04-08 (×2): 80 mg via ORAL
  Filled 2014-04-07 (×2): qty 1

## 2014-04-07 MED ORDER — SODIUM CHLORIDE 0.9 % IV BOLUS (SEPSIS)
1000.0000 mL | Freq: Once | INTRAVENOUS | Status: AC
  Administered 2014-04-07: 1000 mL via INTRAVENOUS

## 2014-04-07 MED ORDER — SENNOSIDES-DOCUSATE SODIUM 8.6-50 MG PO TABS: 1.0000 | ORAL_TABLET | Freq: Two times a day (BID) | ORAL | Status: DC

## 2014-04-07 MED ORDER — ZONISAMIDE 100 MG PO CAPS
300.0000 mg | ORAL_CAPSULE | Freq: Two times a day (BID) | ORAL | Status: DC
  Administered 2014-04-07 – 2014-04-08 (×2): 300 mg via ORAL
  Filled 2014-04-07 (×4): qty 3

## 2014-04-07 MED ORDER — FLUOROMETHOLONE 0.25 % OP SUSP
1.0000 [drp] | Freq: Two times a day (BID) | OPHTHALMIC | Status: DC
  Filled 2014-04-07: qty 5

## 2014-04-07 MED ORDER — ACETAMINOPHEN 325 MG PO TABS: 650.0000 mg | ORAL_TABLET | Freq: Once | ORAL | Status: DC

## 2014-04-07 MED ORDER — ESCITALOPRAM OXALATE 20 MG PO TABS
30.0000 mg | ORAL_TABLET | Freq: Every day | ORAL | Status: DC
  Administered 2014-04-07: 30 mg via ORAL
  Filled 2014-04-07 (×2): qty 1

## 2014-04-07 MED ORDER — HYDROCODONE-ACETAMINOPHEN 5-325 MG PO TABS
2.0000 | ORAL_TABLET | Freq: Once | ORAL | Status: AC
  Administered 2014-04-07: 2 via ORAL
  Filled 2014-04-07: qty 2

## 2014-04-07 MED ORDER — FLUOROMETHOLONE 0.1 % OP SUSP
1.0000 [drp] | Freq: Two times a day (BID) | OPHTHALMIC | Status: DC
  Administered 2014-04-07: 1 [drp] via OPHTHALMIC
  Filled 2014-04-07 (×2): qty 5

## 2014-04-07 MED ORDER — HYDROCODONE-ACETAMINOPHEN 5-325 MG PO TABS
1.0000 | ORAL_TABLET | Freq: Four times a day (QID) | ORAL | Status: DC | PRN
  Administered 2014-04-07 (×3): 1 via ORAL
  Filled 2014-04-07 (×3): qty 1

## 2014-04-07 MED ORDER — ESCITALOPRAM OXALATE 10 MG PO TABS: 10.0000 mg | ORAL_TABLET | Freq: Two times a day (BID) | ORAL | Status: DC

## 2014-04-07 MED ORDER — MEMANTINE HCL 10 MG PO TABS
10.0000 mg | ORAL_TABLET | Freq: Every day | ORAL | Status: DC
  Administered 2014-04-07 – 2014-04-08 (×2): 10 mg via ORAL
  Filled 2014-04-07 (×2): qty 1

## 2014-04-07 MED ORDER — HYDROXYCHLOROQUINE SULFATE 200 MG PO TABS
200.0000 mg | ORAL_TABLET | Freq: Three times a day (TID) | ORAL | Status: DC
  Filled 2014-04-07 (×7): qty 1

## 2014-04-07 MED ORDER — ESCITALOPRAM OXALATE 10 MG PO TABS
10.0000 mg | ORAL_TABLET | Freq: Every day | ORAL | Status: DC
  Administered 2014-04-07 – 2014-04-08 (×2): 10 mg via ORAL
  Filled 2014-04-07 (×2): qty 1

## 2014-04-07 MED ORDER — ONDANSETRON HCL 4 MG PO TABS
4.0000 mg | ORAL_TABLET | Freq: Once | ORAL | Status: AC
  Administered 2014-04-07: 4 mg via ORAL
  Filled 2014-04-07: qty 1

## 2014-04-07 MED ORDER — IOHEXOL 350 MG/ML SOLN
80.0000 mL | Freq: Once | INTRAVENOUS | Status: AC | PRN
  Administered 2014-04-07: 80 mL via INTRAVENOUS

## 2014-04-07 NOTE — Progress Notes (Signed)
Internal teaching service notified of arrival to unit

## 2014-04-07 NOTE — ED Notes (Signed)
Right groin site from procedure on Thursday clean/dry. Covered with op-site.

## 2014-04-07 NOTE — Progress Notes (Signed)
Clydie Braun in ED will call me back with report

## 2014-04-07 NOTE — Progress Notes (Addendum)
Pt with complaint of nausea and pain. Paged provider on call. Awaiting further orders. Pt to get po tylenol and zofran.

## 2014-04-07 NOTE — Progress Notes (Signed)
Subjective: Patient is feeling better now.  No pain when she is lying in bed.  Last night, she was complaining of chest pain and difficulty breathing while in bed.  Patient is taking oral pain meds.   Objective: Vital signs in last 24 hours: Temp:  [97.8 F (36.6 C)-98.6 F (37 C)] 98.1 F (36.7 C) (12/20 1537) Pulse Rate:  [56-61] 56 (12/20 1304) Resp:  [15-20] 20 (12/20 1537) BP: (121-159)/(56-72) 121/56 mmHg (12/20 1537) SpO2:  [97 %-100 %] 98 % (12/20 1537) Weight:  [136 lb 12.8 oz (62.052 kg)] 136 lb 12.8 oz (62.052 kg) (12/20 1304) Last BM Date: 04/06/14  Intake/Output from previous day:   Intake/Output this shift:    Lab Results:   Recent Labs  04/05/14 0424 04/07/14 0600 04/07/14 0614  WBC 6.2 5.0  --   HGB 10.8* 10.5* 10.9*  HCT 33.6* 33.2* 32.0*  PLT 121* 173  --    BMET  Recent Labs  04/07/14 0600 04/07/14 0614  NA 140 140  K 4.0 3.9  CL 102 102  CO2 26  --   GLUCOSE 102* 106*  BUN 14 15  CREATININE 0.84 0.90  CALCIUM 9.2  --    PT/INR No results for input(s): LABPROT, INR in the last 72 hours. ABG No results for input(s): PHART, HCO3 in the last 72 hours.  Invalid input(s): PCO2, PO2  Studies/Results: Ct Angio Chest Pe W/cm &/or Wo Cm  04/07/2014   ADDENDUM REPORT: 04/07/2014 08:20  ADDENDUM: The small thrombus in the peripheral right middle lobe medial segment pulmonary artery branch may well be due to the recent embolization; this small thrombus is near one of the embolization coils. Note that other pulmonary arterial vessels elsewhere appear patent and normal. The asymmetry in ventricular diameter is likely due to left ventricular hypertrophy as opposed to potential right heart strain.   Electronically Signed   By: Bretta Bang M.D.   On: 04/07/2014 08:20   04/07/2014   CLINICAL DATA:  Difficulty breathing. Hemoptysis. Recent lung embolization for pulmonary arteriovenous malformation  EXAM: CT ANGIOGRAPHY CHEST WITH CONTRAST   TECHNIQUE: Multidetector CT imaging of the chest was performed using the standard protocol during bolus administration of intravenous contrast. Multiplanar CT image reconstructions and MIPs were obtained to evaluate the vascular anatomy.  CONTRAST:  48mL OMNIPAQUE IOHEXOL 350 MG/ML SOLN  COMPARISON:  Chest CT March 14, 2013 and chest radiograph obtained earlier in the day  FINDINGS: There is a small pulmonary embolus in a medial segment right middle lobe branch, best seen on axial slices 172 through 175, series 407. No other pulmonary emboli. There is no thoracic aortic aneurysm or dissection. The right ventricle to left ventricle diameter ratio is greater than 1.0 which raises concern for right heart strain.  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 The arteriovenous malformation appears occluded on this study. No contrast extravasation is appreciable currently in this area. Note that there is some susceptibility artifact from the coils which could obscure a tiny contrast leak. There is some patchy ground-glass type opacity in the medial segment of the right middle lobe in the vicinity of the recent arteriovenous malformation. On axial slice 68 series 406, there is a focal opacity adjacent to the right heart border measuring 1.0 x 1.1 cm which probably represents the thrombosed nidus of a portion of this vascular malformation.  There is patchy lower lobe atelectasis bilaterally.  There is  no appreciable thoracic adenopathy. The pericardium is not thickened. There is left ventricular hypertrophy.  Visualized upper abdominal structures are unremarkable. Thyroid appears normal.  Review of the MIP images confirms the above findings.  IMPRESSION: Small peripheral pulmonary embolus in the medial segment right middle lobe. No other pulmonary emboli. Findings concerning for right heart strain. Echocardiography advised. Note that there  is left ventricular hypertrophy.  Patient has had recent coil embolization of an arteriovenous malformation in the medial segment of the right middle lobe. A 1 cm nodular opacity adjacent to the right heart border may represent a portion of thrombosed vessel from this procedure. There is no active appearing extravasation of intravenous contrast from this region, although a tiny leak could be obscured by this susceptibility artifact from the coils in this area focally. There is some patchy ground-glass type opacity which probably represent inflammation from the recent embolization procedure. An early pneumonia in this area is also possible. Hemorrhage from the procedure in this area is a third differential consideration. If hemoptysis persists, repeat CT angiogram chest would be appropriate to assess for the possibility of developing leak or recanalization of vascular malformation.  There is bibasilar atelectatic change.  Critical Value/emergent results were called by telephone at the time of interpretation on 04/07/2014 at 7:32 am to Junius Finner, [place], who verbally acknowledged these results.  Electronically Signed: By: Bretta Bang M.D. On: 04/07/2014 07:35   Dg Chest Port 1 View  04/07/2014   CLINICAL DATA:  Acute onset of hemoptysis, status post embolization of pulmonary AVM. Subsequent encounter.  EXAM: PORTABLE CHEST - 1 VIEW  COMPARISON:  Chest radiograph performed 04/05/2014  FINDINGS: The lungs are well-aerated and clear. There is no evidence of focal opacification, pleural effusion or pneumothorax. The patient is status post coiling of pulmonary AVM at the right lung base.  The cardiomediastinal silhouette is within normal limits. No acute osseous abnormalities are seen.  IMPRESSION: No acute cardiopulmonary process seen.   Electronically Signed   By: Roanna Raider M.D.   On: 04/07/2014 06:36    Anti-infectives: Anti-infectives    Start     Dose/Rate Route Frequency Ordered Stop   04/07/14  1730  hydroxychloroquine (PLAQUENIL) tablet 200 mg     200 mg Oral 3 times daily after meals & bedtime 04/07/14 1328        Assessment/Plan: Post pulmonary AVM embolization on 04/04/17.  Discharged on 04/05/14 and re-admitted for persistent pain and small amount of hemoptysis.  Patient had a Chest CTA this morning.  The embolization coils are in stable position with a tiny amount of thrombus within the embolized pulmonary artery, as expected.  No other evidence for pulmonary emboli.  CT demonstrated parenchyma densities in the medial RML near the embolization coils.  Findings are most compatible with pulmonary infarct/inflammation.  Appreciate Internal Medicine for evaluating and caring for this patient.  Continue to monitor for hemoptysis.  IR will continue to follow while in hospital.        [facility], Rasheedah Reis Alycia Rossetti 04/07/2014

## 2014-04-07 NOTE — ED Notes (Addendum)
Pt arrives from home with c/o coughing up blood, post op embolism coil on Thursday and states bleeding has not stopped since, states she feels weak, pain in chest and head. Pt states she's coughing up multiple clots an hour, about the size of half dollar

## 2014-04-07 NOTE — Progress Notes (Signed)
Arrived to unit 5w23 at this time. Ambulated to bed from hallway-gait steady. C/O upper mid abd discomfort. Instructed pt to call me if coughs up blood. instructued on call light system and room surroundings. Husband at bedside

## 2014-04-07 NOTE — H&P (Signed)
Date: 04/07/2014               Patient Name:  Priscilla Duke MRN: [id number]  DOB: 1960-08-05 Age / Sex: 53 y.o., female   PCP: Raynelle Jan, MD         Medical Service: Internal Medicine Teaching Service         Attending Physician: Dr. Inez Catalina, MD    First Contact: Dr. Danella Penton Pager: [phone]  Second Contact: Dr. Delane Ginger Pager: [phone]       After Hours (After 5p/  First Contact Pager: [phone]  weekends / holidays): Second Contact Pager: [phone]   Chief Complaint: chest pain, hemoptysis   History of Present Illness: Pt is a [name] w/ PMHx of HTN, hx of CVA (12/2013), and pulmonary AVMs w/ recent RML embolization on 04/04/14 who presents to the ED w/ hemoptysis and chest pain. Pt had a AVM in the RML embolized w/ Dr. Fredia Sorrow on 04/04/14. Since the procedure patient has experienced hemoptysis and rt sided chest pain. Pt rates pain as 7/10 in severity that is a sharp pain. Pain is located under rt breast and along diaphragm. Percocet helps reducechest pain to a 4/10. Chest pain is intermittent, exacerbated by movement and associated w/ dyspnea. Pt reports hemoptysis that subsided after her embolization procedure but then worsened Saturday morning. She states Saturday morning she had black sputum production that was increased from previous days. Sputum color varies from black to green with streaking. Dr. Lowella Dandy is aware pt is admitted. He does not recommend further intervention required at this time, advised not to start anticoagulants.     Meds: No current facility-administered medications for this encounter.   Current Outpatient Prescriptions  Medication Sig Dispense Refill  . apixaban (ELIQUIS) 5 MG TABS tablet Take 5 mg by mouth 2 (two) times daily.     Marland Kitchen aspirin EC 81 MG tablet Take 81 mg by mouth.    Marland Kitchen atorvastatin (LIPITOR) 80 MG tablet Take 80 mg by mouth daily.    . bisoprolol-hydrochlorothiazide (ZIAC) 5-6.25 MG per tablet Take 1 tablet by mouth daily.    Marland Kitchen doxycycline  (VIBRA-TABS) 100 MG tablet Take 100 mg by mouth every morning.  1  . escitalopram (LEXAPRO) 10 MG tablet Take 10-30 mg by mouth 2 (two) times daily. 10mg  in the morning and 30mg  in the evening    . fish oil-omega-3 fatty acids 1000 MG capsule Take 1 g by mouth daily.      . fluorometholone (FML FORTE) 0.25 % ophthalmic suspension Place 1 drop into both eyes 2 (two) times daily.     . Hydrocodone-Acetaminophen 5-300 MG TABS Take 1-2 tablets by mouth every 4 (four) hours as needed (for pain).    . hydroxychloroquine (PLAQUENIL) 200 MG tablet Take 200 mg by mouth 4 (four) times daily - after meals and at bedtime.     . memantine (NAMENDA) 10 MG tablet Take 10 mg by mouth daily.      zonisamide (ZONEGRAN) 100 MG capsule Take 300 mg by mouth 2 (two) times daily.     EPINEPHrine (EPIPEN 2-PAK) 0.3 mg/0.3 mL DEVI Use as directed       Allergies: Allergies as of 04/07/2014 - Review Complete 04/07/2014  Allergen Reaction Noted  . Penicillins Shortness Of Breath, Itching, and Rash 03/25/2010  . Shellfish allergy Shortness Of Breath and Itching 03/29/2013  . Olive oil Diarrhea and Nausea And Vomiting 03/29/2013   Past Medical History  Diagnosis  Date  . 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 Didelphic uterus     Dr. Madelaine Etienne  . Colonic polyp   . Hypertension   . Depression   . Hypercholesterolemia   . Allergy     skin, scalp  . IBS (irritable bowel syndrome)   . Anxiety   . GERD (gastroesophageal reflux disease)   . Inguinal hernia     right   . PONV (postoperative nausea and vomiting)     "just after hemorrhoidectomy"  . Heart murmur     as a child and "when I get sick"  . Shortness of breath dyspnea   . Chronic bronchitis     "get it q winter"  . Headache     "at least weekly" (04/04/2014)  . Migraine     "a few times/yr" (04/04/2014)  . Seizures     "none for years now" (04/04/2014)  . Arthritis     "down my entire spine; hands, knees" (04/04/2014)  . Mild  cognitive impairment since 2006 stroke  . Stroke 2006; 01/04/2014    "mild word finding problems since; mild weakness right side" (04/04/2014)   Past Surgical History  Procedure Laterality Date  . Excisional hemorrhoidectomy  ~ 2009  . Abdominoplasty  2007  . Colonoscopy w/ biopsies and polypectomy    . Anal fissure repair  1981    due to tear  . 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 Augmentation mammaplasty  2007  . Breast implant removal  05/2009    w/lift  . Vaginal hysterectomy  2003  . Tubal ligation  1998  . Radiology with anesthesia N/A 04/04/2014    Procedure: RADIOLOGY WITH ANESTHESIA;  Surgeon: Reola Calkins, MD;  Location: [facility] OR;  Service: Radiology;  Laterality: N/A;  DR. Fredia Sorrow PERFORMING   Family History  Problem Relation Age of Onset  . Aneurysm Sister   . Hypertension Other   . Hypertension Mother   . Stroke Mother   . Migraines Mother   . Seizures Father   . Asthma Brother    History   Social History  . Marital Status: Married    Spouse Name: N/A    Number of Children: 5  . Years of Education: N/A   Occupational History  . Disability, former speech pathologist    Social History Main Topics  . Smoking status: Former Smoker -- 1.00 packs/day for 20 years    Types: Cigarettes, E-cigarettes    Quit date: 01/04/2014  . Smokeless tobacco: Never Used  . Alcohol Use: 4.2 oz/week    7 Glasses of wine per week  . Drug Use: No  . Sexual Activity: Yes   Other Topics Concern  . Not on file   Social History Narrative    Review of Systems: Denies fevers, NS, chills, and vomiting.   Positive for improvement in vision, nausea, constipation ( last BM was yesterday), dyspnea, rt sided weakness from previous CVA, lightheaded 2/2 dyspnea, and chest pain as above.   Physical Exam: Blood pressure 139/68, pulse 61, temperature 97.8 F (36.6 C), temperature source Oral, resp. rate 16, SpO2 100  %. General: NAD HEENT: moist mucous membranes, no deviation of tongue on protrusion Lungs: CTAB, no wheezes Cardiac: RRR, no murmurs, rt chest wall tenderness to palpation GI: soft, tender to palpation of rt lower quadrant, active bowel sounds MSK: trace pedal edema b/l, 5/5 upper extremity strength b/l,  4/5 RLE  Neuro: CN 2-12 grossly intact, normal finger to nose exam  Lab results: Basic Metabolic Panel:  Recent Labs  16/94/50 0600 04/07/14 0614  NA 140 140  K 4.0 3.9  CL 102 102  CO2 26  --   GLUCOSE 102* 106*  BUN 14 15  CREATININE 0.84 0.90  CALCIUM 9.2  --    Liver Function Tests:  Recent Labs  04/07/14 0600  AST 27  ALT 32  ALKPHOS 94  BILITOT <0.2*  PROT 6.3  ALBUMIN 3.4*    Recent Labs  04/07/14 0600  LIPASE 17   CBC:  Recent Labs  04/05/14 0424 04/07/14 0600 04/07/14 0614  WBC 6.2 5.0  --   NEUTROABS  --  2.5  --   HGB 10.8* 10.5* 10.9*  HCT 33.6* 33.2* 32.0*  MCV 88.2 87.6  --   PLT 121* 173  --    Urinalysis:  Recent Labs  04/07/14 0715  COLORURINE YELLOW  LABSPEC 1.020  PHURINE 7.5  GLUCOSEU NEGATIVE  HGBUR NEGATIVE  BILIRUBINUR NEGATIVE  KETONESUR NEGATIVE  PROTEINUR NEGATIVE  UROBILINOGEN 0.2  NITRITE NEGATIVE  LEUKOCYTESUR NEGATIVE     Imaging results:  Ct Angio Chest Pe W/cm &/or Wo Cm  04/07/2014   ADDENDUM REPORT: 04/07/2014 08:20  ADDENDUM: The small thrombus in the peripheral right middle lobe medial segment pulmonary artery branch may well be due to the recent embolization; this small thrombus is near one of the embolization coils. Note that other pulmonary arterial vessels elsewhere appear patent and normal. The asymmetry in ventricular diameter is likely due to left ventricular hypertrophy as opposed to potential right heart strain.   Electronically Signed   By: Bretta Bang M.D.   On: 04/07/2014 08:20   04/07/2014   CLINICAL DATA:  Difficulty breathing. Hemoptysis. Recent lung embolization for pulmonary  arteriovenous malformation  EXAM: CT ANGIOGRAPHY CHEST WITH CONTRAST  TECHNIQUE: Multidetector CT imaging of the chest was performed using the standard protocol during bolus administration of intravenous contrast. Multiplanar CT image reconstructions and MIPs were obtained to evaluate the vascular anatomy.  CONTRAST:  56mL OMNIPAQUE IOHEXOL 350 MG/ML SOLN  COMPARISON:  Chest CT March 14, 2013 and chest radiograph obtained earlier in the day  FINDINGS: There is a small pulmonary embolus in a medial segment right middle lobe branch, best seen on axial slices 172 through 175, series 407. No other pulmonary emboli. There is no thoracic aortic aneurysm or dissection. The right ventricle to left ventricle diameter ratio is greater than 1.0 which raises concern for right heart strain.  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 The arteriovenous malformation appears occluded on this study. No contrast extravasation is appreciable currently in this area. Note that there is some susceptibility artifact from the coils which could obscure a tiny contrast leak. There is some patchy ground-glass type opacity in the medial segment of the right middle lobe in the vicinity of the recent arteriovenous malformation. On axial slice 68 series 406, there is a focal opacity adjacent to the right heart border measuring 1.0 x 1.1 cm which probably represents the thrombosed nidus of a portion of this vascular malformation.  There is patchy lower lobe atelectasis bilaterally.  There is no appreciable thoracic adenopathy. The pericardium is not thickened. There is left ventricular hypertrophy.  Visualized upper abdominal structures are unremarkable. Thyroid appears normal.  Review of the MIP images confirms the above findings.  IMPRESSION: Small peripheral pulmonary embolus in the medial segment right middle lobe. No other pulmonary emboli. Findings  concerning for right heart strain. Echocardiography advised. Note that there is left ventricular hypertrophy.  Patient has had recent coil embolization of an arteriovenous malformation in the medial segment of the right middle lobe. A 1 cm nodular opacity adjacent to the right heart border may represent a portion of thrombosed vessel from this procedure. There is no active appearing extravasation of intravenous contrast from this region, although a tiny leak could be obscured by this susceptibility artifact from the coils in this area focally. There is some patchy ground-glass type opacity which probably represent inflammation from the recent embolization procedure. An early pneumonia in this area is also possible. Hemorrhage from the procedure in this area is a third differential consideration. If hemoptysis persists, repeat CT angiogram chest would be appropriate to assess for the possibility of developing leak or recanalization of vascular malformation.  There is bibasilar atelectatic change.  Critical Value/emergent results were called by telephone at the time of interpretation on 04/07/2014 at 7:32 am to Junius Finner, [place], who verbally acknowledged these results.  Electronically Signed: By: Bretta Bang M.D. On: 04/07/2014 07:35   Dg Chest Port 1 View  04/07/2014   CLINICAL DATA:  Acute onset of hemoptysis, status post embolization of pulmonary AVM. Subsequent encounter.  EXAM: PORTABLE CHEST - 1 VIEW  COMPARISON:  Chest radiograph performed 04/05/2014  FINDINGS: The lungs are well-aerated and clear. There is no evidence of focal opacification, pleural effusion or pneumothorax. The patient is status post coiling of pulmonary AVM at the right lung base.  The cardiomediastinal silhouette is within normal limits. No acute osseous abnormalities are seen.  IMPRESSION: No acute cardiopulmonary process seen.   Electronically Signed   By: Roanna Raider M.D.   On: 04/07/2014 06:36    Other results: EKG:  normal EKG, normal sinus rhythm, unchanged from previous tracings.  Assessment & Plan by Problem: Active Problems:   Chest pain  Pulmonary embolism-- small peripheral pulmonary embolus in the medial segment of right middle lobe w/ rt heart strain noted on CTA. Pt had a recent AVM embolization in the RML on 04/04/14. Spoke w/ Dr. Lowella Dandy w/ vascular sx who states that a small PE near site of AVM embolization is not uncommon. Recommends against starting anticoagulants at this time.  - admit to tele - observe symptoms overnight - pulmonology consulted in the ED, appreciate their recommendations - orthostatic vitals  Seizures - continue home med zonegran 300mg  BID  HLD - continue home med lipitor 80mg    Depression - continue home med lexaprol  RA - continue home plaquenil 200mg  TID  Constipation-- last BM yesterday. Likely due to pain medications.  -senokot BID  DVT prophylaxis-- SCDs   Dispo: Disposition is deferred at this time, awaiting improvement of current medical problems. Anticipated discharge in approximately 1-2 day(s).   The patient does have a current PCP Raynelle Jan, MD) and does not need an [facility] hospital follow-up appointment after discharge.  The patient does not have transportation limitations that hinder transportation to clinic appointments.  Signed: Gara Kroner, MD 04/07/2014, 10:03 AM

## 2014-04-07 NOTE — ED Notes (Signed)
Dr. Delane Ginger paged -- pt c/o pain in head (headache) and pain in right chest

## 2014-04-07 NOTE — ED Notes (Signed)
Dr Delane Ginger returned call, informed of pt's pain, and request to eat. States will look at chart.

## 2014-04-07 NOTE — ED Provider Notes (Signed)
CSN: [id number]     Arrival date & time 04/07/14  0544 History   First MD Initiated Contact with Patient 04/07/14 [phone]     Chief Complaint  Patient presents with  . Post-op Problem     (Consider location/radiation/quality/duration/timing/severity/associated sxs/prior Treatment) HPI Pt is a 53yo female with hx of hemorrhagic stroke right parietal in 2006 and left sided stroke 01/04/14 resulting in mild word finding problems and mild right sided weakness, HTN, hypercholesterolemia, anxiety, GERD, presenting to ED with right sided anterior chest pain associated with hemoptysis s/p 3 days AVM embolization in IR with Dr Fredia Sorrow and Dr Corliss Skains.  Reports coughing up blood ranging from black in color up to 1 table spoon amount to bright red blood tinged sputum.  Chest pain is constant, aching with intermittent episodes of sharp stabbing pain that are 9/10 at worst, worse when she lies flat at night and has to move to get up in the morning. She has been taking Vicodin with mild to moderate relief.  Pt is poor historian but believes she was referred to Dr. Fredia Sorrow by her Neurologist Dr. Hyacinth Meeker at [facility] or her cardiologist in [facility], Dr. Reuel Boom.  Pt reports f/u with a pulmonologist in [facility] after her initial stroke in 2006 but did not agree with his assessment, she does not recall who she saw. Reports being advised to discontinue her Eliquis due to "bleeding issues" 1 week ago and advised after a coil was placed she would not need to restart her blood thinner.  Pt c/o feeling intermittent weakness and lightheadedness, "like I am going to pass out." Denies HA or syncope.  Denies fever, chills, n/v/d.      Past Medical History  Diagnosis Date  . 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 Didelphic uterus     Dr. Madelaine Etienne  . Colonic polyp   . Hypertension   . Depression   . Hypercholesterolemia   . Allergy     skin, scalp  . IBS (irritable bowel syndrome)   . Anxiety   .  GERD (gastroesophageal reflux disease)   . Inguinal hernia     right   . PONV (postoperative nausea and vomiting)     "just after hemorrhoidectomy"  . Heart murmur     as a child and "when I get sick"  . Shortness of breath dyspnea   . Chronic bronchitis     "get it q winter"  . Headache     "at least weekly" (04/04/2014)  . Migraine     "a few times/yr" (04/04/2014)  . Seizures     "none for years now" (04/04/2014)  . Arthritis     "down my entire spine; hands, knees" (04/04/2014)  . Mild cognitive impairment since 2006 stroke  . Stroke 2006; 01/04/2014    "mild word finding problems since; mild weakness right side" (04/04/2014)   Past Surgical History  Procedure Laterality Date  . Excisional hemorrhoidectomy  ~ 2009  . Abdominoplasty  2007  . Colonoscopy w/ biopsies and polypectomy    . Anal fissure repair  1981    due to tear  . 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 Augmentation mammaplasty  2007  . Breast implant removal  05/2009    w/lift  . Vaginal hysterectomy  2003  . Tubal ligation  1998  . Radiology with anesthesia N/A 04/04/2014    Procedure: RADIOLOGY WITH  ANESTHESIA;  Surgeon: Reola Calkins, MD;  Location: [facility] OR;  Service: Radiology;  Laterality: N/A;  DR. Fredia Sorrow PERFORMING   Family History  Problem Relation Age of Onset  . Aneurysm Sister   . Hypertension Other   . Hypertension Mother   . Stroke Mother   . Migraines Mother   . Seizures Father   . Asthma Brother    History  Substance Use Topics  . Smoking status: Former Smoker -- 1.00 packs/day for 20 years    Types: Cigarettes, E-cigarettes    Quit date: 01/04/2014  . Smokeless tobacco: Never Used  . Alcohol Use: 4.2 oz/week    7 Glasses of wine per week   OB History    No data available     Review of Systems  Constitutional: Negative for fever and chills.  Respiratory: Negative for cough and shortness of breath.    Cardiovascular: Positive for chest pain. Negative for palpitations and leg swelling.  Gastrointestinal: Negative for nausea, vomiting, abdominal pain and diarrhea.  Neurological: Positive for light-headedness. Negative for syncope, numbness and headaches.  All other systems reviewed and are negative.     Allergies  Penicillins; Shellfish allergy; and Olive oil  Home Medications   Prior to Admission medications   Medication Sig Start Date End Date Taking? Authorizing Provider  apixaban (ELIQUIS) 5 MG TABS tablet Take 5 mg by mouth 2 (two) times daily.    Yes Historical Provider, MD  aspirin EC 81 MG tablet Take 81 mg by mouth.   Yes Historical Provider, MD  atorvastatin (LIPITOR) 80 MG tablet Take 80 mg by mouth daily.   Yes Historical Provider, MD  bisoprolol-hydrochlorothiazide (ZIAC) 5-6.25 MG per tablet Take 1 tablet by mouth daily.   Yes Historical Provider, MD  doxycycline (VIBRA-TABS) 100 MG tablet Take 100 mg by mouth every morning. 02/26/14  Yes Historical Provider, MD  escitalopram (LEXAPRO) 10 MG tablet Take 10-30 mg by mouth 2 (two) times daily.  in the morning and  in the evening   Yes Historical Provider, MD  fish oil-omega-3 fatty acids 1000 MG capsule Take 1 g by mouth daily.     Yes Historical Provider, MD  fluorometholone (FML FORTE) 0.25 % ophthalmic suspension Place 1 drop into both eyes 2 (two) times daily.    Yes Historical Provider, MD  Hydrocodone-Acetaminophen 5-300 MG TABS Take 1-2 tablets by mouth every 4 (four) hours as needed (for pain).   Yes Historical Provider, MD  hydroxychloroquine (PLAQUENIL) 200 MG tablet Take 200 mg by mouth 4 (four) times daily - after meals and at bedtime.  04/05/14  Yes Historical Provider, MD  memantine (NAMENDA) 10 MG tablet Take 10 mg by mouth daily.     Yes Historical Provider, MD  zonisamide (ZONEGRAN) 100 MG capsule Take 300 mg by mouth 2 (two) times daily.    Yes Historical Provider, MD  EPINEPHrine (EPIPEN 2-PAK) 0.3  mg/0.3 mL DEVI Use as directed     Historical Provider, MD   BP 139/68 mmHg  Pulse 61  Temp(Src) 97.8 F (36.6 C) (Oral)  Resp 16  SpO2 100% Physical Exam  Constitutional: She appears well-developed and well-nourished. No distress.  HENT:  Head: Normocephalic and atraumatic.  Eyes: Conjunctivae are normal. No scleral icterus.  Neck: Normal range of motion.  Cardiovascular: Normal rate, regular rhythm and normal heart sounds.   Pulmonary/Chest: Effort normal and breath sounds normal. No respiratory distress. She has no wheezes. She has no rales. She exhibits no tenderness.  Lungs: decreased breath sounds in lower lung fields. No wheeze or rhonchi.  Chest wall tenderness to right anterior chest wall. No crepitus.  Abdominal: Soft. Bowel sounds are normal. She exhibits no distension and no mass. There is no tenderness. There is no rebound and no guarding.  Musculoskeletal: Normal range of motion.  Neurological: She is alert.  Skin: Skin is warm and dry. She is not diaphoretic. No erythema.  Chest wall: no erythema, ecchymosis or lesions. Right groin: surgical site covered with clean dry bandage  Nursing note and vitals reviewed.   ED Course  Procedures (including critical care time) Labs Review Labs Reviewed  CBC WITH DIFFERENTIAL - Abnormal; Notable for the following:    RBC 3.79 (*)    Hemoglobin 10.5 (*)    HCT 33.2 (*)    All other components within normal limits  COMPREHENSIVE METABOLIC PANEL - Abnormal; Notable for the following:    Glucose, Bld 102 (*)    Albumin 3.4 (*)    Total Bilirubin <0.2 (*)    GFR calc non Af Amer 77 (*)    GFR calc Af Amer 89 (*)    All other components within normal limits  I-STAT CHEM 8, ED - Abnormal; Notable for the following:    Glucose, Bld 106 (*)    Hemoglobin 10.9 (*)    HCT 32.0 (*)    All other components within normal limits  URINE CULTURE  LIPASE, BLOOD  URINALYSIS, ROUTINE W REFLEX MICROSCOPIC  PREGNANCY, URINE  I-STAT  TROPOININ, ED    Imaging Review Ct Angio Chest Pe W/cm &/or Wo Cm  04/07/2014   CLINICAL DATA:  Difficulty breathing. Hemoptysis. Recent lung embolization for pulmonary arteriovenous malformation  EXAM: CT ANGIOGRAPHY CHEST WITH CONTRAST  TECHNIQUE: Multidetector CT imaging of the chest was performed using the standard protocol during bolus administration of intravenous contrast. Multiplanar CT image reconstructions and MIPs were obtained to evaluate the vascular anatomy.  CONTRAST:  59mL OMNIPAQUE IOHEXOL 350 MG/ML SOLN  COMPARISON:  Chest CT March 14, 2013 and chest radiograph obtained earlier in the day  FINDINGS: There is a small pulmonary embolus in a medial segment right middle lobe branch, best seen on axial slices 172 through 175, series 407. No other pulmonary emboli. There is no thoracic aortic aneurysm or dissection. The right ventricle to left ventricle diameter ratio is greater than 1.0 which raises concern for right heart strain.  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 The arteriovenous malformation appears occluded on this study. No contrast extravasation is appreciable currently in this area. Note that there is some susceptibility artifact from the coils which could obscure a tiny contrast leak. There is some patchy ground-glass type opacity in the medial segment of the right middle lobe in the vicinity of the recent arteriovenous malformation. On axial slice 68 series 406, there is a focal opacity adjacent to the right heart border measuring 1.0 x 1.1 cm which probably represents the thrombosed nidus of a portion of this vascular malformation.  There is patchy lower lobe atelectasis bilaterally.  There is no appreciable thoracic adenopathy. The pericardium is not thickened. There is left ventricular hypertrophy.  Visualized upper abdominal structures are unremarkable. Thyroid appears normal.  Review of  the MIP images confirms the above findings.  IMPRESSION: Small peripheral pulmonary embolus in the medial segment right middle lobe. No other pulmonary emboli. Findings concerning for right heart strain. Echocardiography advised. Note  that there is left ventricular hypertrophy.  Patient has had recent coil embolization of an arteriovenous malformation in the medial segment of the right middle lobe. A 1 cm nodular opacity adjacent to the right heart border may represent a portion of thrombosed vessel from this procedure. There is no active appearing extravasation of intravenous contrast from this region, although a tiny leak could be obscured by this susceptibility artifact from the coils in this area focally. There is some patchy ground-glass type opacity which probably represent inflammation from the recent embolization procedure. An early pneumonia in this area is also possible. Hemorrhage from the procedure in this area is a third differential consideration. If hemoptysis persists, repeat CT angiogram chest would be appropriate to assess for the possibility of developing leak or recanalization of vascular malformation.  There is bibasilar atelectatic change.  Critical Value/emergent results were called by telephone at the time of interpretation on 04/07/2014 at 7:32 am to Junius Finner, [place], who verbally acknowledged these results.   Electronically Signed   By: Bretta Bang M.D.   On: 04/07/2014 07:35   Dg Chest Port 1 View  04/07/2014   CLINICAL DATA:  Acute onset of hemoptysis, status post embolization of pulmonary AVM. Subsequent encounter.  EXAM: PORTABLE CHEST - 1 VIEW  COMPARISON:  Chest radiograph performed 04/05/2014  FINDINGS: The lungs are well-aerated and clear. There is no evidence of focal opacification, pleural effusion or pneumothorax. The patient is status post coiling of pulmonary AVM at the right lung base.  The cardiomediastinal silhouette is within normal limits. No acute osseous  abnormalities are seen.  IMPRESSION: No acute cardiopulmonary process seen.   Electronically Signed   By: Roanna Raider M.D.   On: 04/07/2014 06:36   Dg Chest Port 1 View  04/05/2014   CLINICAL DATA:  Hemoptysis  EXAM: PORTABLE CHEST - 1 VIEW  COMPARISON:  04/04/2014  FINDINGS: Vascular coils are again noted within the right middle lobe. The heart size and mediastinal contours are within normal limits. Both lungs are clear. The visualized skeletal structures are unremarkable.  IMPRESSION: 1. No active cardiopulmonary abnormalities.   Electronically Signed   By: Signa Kell M.D.   On: 04/05/2014 08:30     EKG Interpretation   Date/Time:  Sunday April 07 2014 06:06:18 EST Ventricular Rate:  59 PR Interval:  135 QRS Duration: 84 QT Interval:  454 QTC Calculation: 450 R Axis:   69 Text Interpretation:  Sinus rhythm Confirmed by Erroll Luna  [phone]) on 04/07/2014 6:48:00 AM      MDM   Final diagnoses:  Chest pain  Chest pain on breathing    Pt is a 53yo female presenting to ED s/p RML pulmonary AVM embolization 04/04/14 in IR with Dr Fredia Sorrow and Dr Corliss Skains.  Pt was discharged on 04/05/14.  Today, pt c/o right lower anterior chest pain that is worse when lying down, associated with hemoptysis.  Largest blood clot pt coughed up was about 1 table spoon.  Pt c/o feeling lightheaded.  She was advised to D/C her Eliquis 1 week ago and advised the coil placed would allow her to stay off the Eliquis.  On exam, pt is tender to right anterior chest. Lungs: decreased breath sounds in lower lung fields w/o wheeze or crackles.  Vitals: pt is afebile, O2-100% on RA.  Dr. Mora Bellman was consulted by radiology about this pt, who recommended pt get a CT angio chest.  CT angio chest: significant for small peripheral pulmonary embolus in  the medial segment right middle lobe. No other pulmonary emboli.  Findings also concerning for right heart strain, echocardiography advised as there is left  ventricular hypertrophy.  Discussed pt with Dr. Radford Pax who recommended having pt admitted to medicine service.  PCP is Dr. Carolyne Fiscal in [facility], will page internal medicine: unassigned"   8:35 AM Consulted with Dr. Lowella Dandy who is familiar with the pt and was part of her discharge team on 04/05/14.  He has examined the CT angio chest, addendum to CT angio chest has been made: small thrombus in peripheral right middle lobe, medial segment pulmonary artery branch.  He believes pt's symptoms more c/w an infarct near site of recent procedure. He does Not recommend pt be anticoagulated.  Recommends pt be evaluated by pulmonology as she does not appear to be followed by pulmonology at this time.      8:53 AM  Consulted with pulmonology, medicine service may page Dr. Delford Field with pulmonology if pt needs inpatient pulmnolary consult.  He may be reached at [phone].  Dr. Radford Pax currently attempting to reach internal medicine to admit pt.      Pt will be admitted under the care of Dr. Criselda Peaches. Pt stable at this time.    Junius Finner, PA-C 04/07/14 [address] PA-C 04/07/14 1015  Nelia Shi, MD 04/07/14 1017

## 2014-04-08 DIAGNOSIS — R042 Hemoptysis: Secondary | ICD-10-CM

## 2014-04-08 DIAGNOSIS — I280 Arteriovenous fistula of pulmonary vessels: Secondary | ICD-10-CM

## 2014-04-08 DIAGNOSIS — R0781 Pleurodynia: Secondary | ICD-10-CM | POA: Diagnosis not present

## 2014-04-08 DIAGNOSIS — I2699 Other pulmonary embolism without acute cor pulmonale: Secondary | ICD-10-CM

## 2014-04-08 DIAGNOSIS — I10 Essential (primary) hypertension: Secondary | ICD-10-CM | POA: Diagnosis not present

## 2014-04-08 DIAGNOSIS — R938 Abnormal findings on diagnostic imaging of other specified body structures: Secondary | ICD-10-CM

## 2014-04-08 DIAGNOSIS — E780 Pure hypercholesterolemia: Secondary | ICD-10-CM | POA: Diagnosis not present

## 2014-04-08 DIAGNOSIS — F419 Anxiety disorder, unspecified: Secondary | ICD-10-CM | POA: Diagnosis not present

## 2014-04-08 LAB — BASIC METABOLIC PANEL
ANION GAP: 11 (ref 5–15)
BUN: 16 mg/dL (ref 6–23)
CALCIUM: 9.1 mg/dL (ref 8.4–10.5)
CO2: 27 mEq/L (ref 19–32)
Chloride: 107 mEq/L (ref 96–112)
Creatinine, Ser: 0.86 mg/dL (ref 0.50–1.10)
GFR calc non Af Amer: 75 mL/min — ABNORMAL LOW (ref >90)
GFR, EST AFRICAN AMERICAN: 87 mL/min — AB (ref >90)
Glucose, Bld: 106 mg/dL — ABNORMAL HIGH (ref 70–99)
Potassium: 4.4 mEq/L (ref 3.7–5.3)
Sodium: 145 mEq/L (ref 137–147)

## 2014-04-08 LAB — CBC
HEMATOCRIT: 34.3 % — AB (ref 36.0–46.0)
Hemoglobin: 10.6 g/dL — ABNORMAL LOW (ref 12.0–15.0)
MCH: 27.5 pg (ref 26.0–34.0)
MCHC: 30.9 g/dL (ref 30.0–36.0)
MCV: 88.9 fL (ref 78.0–100.0)
PLATELETS: 180 10*3/uL (ref 150–400)
RBC: 3.86 MIL/uL — ABNORMAL LOW (ref 3.87–5.11)
RDW: 13.6 % (ref 11.5–15.5)
WBC: 5.3 10*3/uL (ref 4.0–10.5)

## 2014-04-08 LAB — URINE CULTURE: Colony Count: 5000

## 2014-04-08 LAB — GLUCOSE, CAPILLARY: Glucose-Capillary: 94 mg/dL (ref 70–99)

## 2014-04-08 NOTE — Progress Notes (Signed)
UR completed 

## 2014-04-08 NOTE — Progress Notes (Addendum)
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 Noted also was [facility] Pt was seen by Dr Lowella Dandy 12/18 prior to discharge and again 12/20 day of readmission Pt now with minimal chest pain Minimal productive cough--- no hemoptysis x over 24 hrs Chest pain is slight and only occasional Discussed CTA finding and reviewed imaging with Dr Miles Costain  Allergies: Penicillins; Shellfish allergy; and Olive oil  Medications: Prior to Admission medications   Medication Sig Start Date End Date Taking? Authorizing Provider  apixaban (ELIQUIS) 5 MG TABS tablet Take 5 mg by mouth 2 (two) times daily.    Yes Historical Provider, MD  aspirin EC 81 MG tablet Take 81 mg by mouth.   Yes Historical Provider, MD  atorvastatin (LIPITOR) 80 MG tablet Take 80 mg by mouth daily.   Yes Historical Provider, MD  bisoprolol-hydrochlorothiazide (ZIAC) 5-6.25 MG per tablet Take 1 tablet by mouth daily.   Yes Historical Provider, MD  doxycycline (VIBRA-TABS) 100 MG tablet Take 100 mg by mouth every morning. 02/26/14  Yes Historical Provider, MD  escitalopram (LEXAPRO) 10 MG tablet Take 10-30 mg by mouth 2 (two) times daily. 10mg  in the morning and 30mg  in the evening   Yes Historical Provider, MD  fish oil-omega-3 fatty acids 1000 MG capsule Take 1 g by mouth daily.     Yes Historical Provider, MD  fluorometholone (FML FORTE) 0.25 % ophthalmic suspension Place 1 drop into both eyes 2 (two) times daily.    Yes Historical Provider, MD  Hydrocodone-Acetaminophen 5-300 MG TABS Take 1-2 tablets by mouth every 4 (four) hours as needed (for pain).   Yes Historical Provider, MD  hydroxychloroquine (PLAQUENIL) 200 MG tablet Take 200 mg by mouth 4 (four) times daily - after meals and at bedtime.  04/05/14  Yes  Historical Provider, MD  memantine (NAMENDA) 10 MG tablet Take 10 mg by mouth daily.     Yes Historical Provider, MD  zonisamide (ZONEGRAN) 100 MG capsule Take 300 mg by mouth 2 (two) times daily.    Yes Historical Provider, MD  EPINEPHrine (EPIPEN 2-PAK) 0.3 mg/0.3 mL DEVI Use as directed     Historical Provider, MD    Review of Systems  Vital Signs: BP 141/77 mmHg  Pulse 60  Temp(Src) 98.4 F (36.9 C) (Oral)  Resp 18  Ht 5\' 3"  (1.6 m)  Wt 63.3 kg (139 lb 8.8 oz)  BMI 24.73 kg/m2  SpO2 96%  Physical Exam  Cardiovascular: Normal rate and regular rhythm.   Pulmonary/Chest: Effort normal and breath sounds normal. She has no wheezes.    Imaging: Ct Angio Chest Pe W/cm &/or Wo Cm  04/07/2014   ADDENDUM REPORT: 04/07/2014 08:20  ADDENDUM: The small thrombus in the peripheral right middle lobe medial segment pulmonary artery branch may well be due to the recent embolization; this small thrombus is near one of the embolization coils. Note that other pulmonary arterial vessels elsewhere appear patent and normal. The asymmetry in ventricular diameter is likely due to left ventricular hypertrophy as opposed to potential right heart strain.   Electronically Signed   By: Bretta Bang M.D.   On: 04/07/2014 08:20   04/07/2014   CLINICAL DATA:  Difficulty breathing. Hemoptysis. Recent lung embolization for pulmonary arteriovenous malformation  EXAM: CT ANGIOGRAPHY CHEST WITH CONTRAST  TECHNIQUE: Multidetector CT imaging of the chest was performed using the standard protocol during bolus administration of intravenous contrast. Multiplanar CT image reconstructions and MIPs were obtained to evaluate the vascular anatomy.  CONTRAST:  91mL OMNIPAQUE IOHEXOL 350 MG/ML SOLN  COMPARISON:  Chest CT March 14, 2013 and chest radiograph obtained earlier in the day  FINDINGS: There is a small pulmonary embolus in a medial segment right middle lobe branch, best seen on axial slices 172 through 175, series  407. No other pulmonary emboli. There is no thoracic aortic aneurysm or dissection. The right ventricle to left ventricle diameter ratio is greater than 1.0 which raises concern for right heart strain.  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 The arteriovenous malformation appears occluded on this study. No contrast extravasation is appreciable currently in this area. Note that there is some susceptibility artifact from the coils which could obscure a tiny contrast leak. There is some patchy ground-glass type opacity in the medial segment of the right middle lobe in the vicinity of the recent arteriovenous malformation. On axial slice 68 series 406, there is a focal opacity adjacent to the right heart border measuring 1.0 x 1.1 cm which probably represents the thrombosed nidus of a portion of this vascular malformation.  There is patchy lower lobe atelectasis bilaterally.  There is no appreciable thoracic adenopathy. The pericardium is not thickened. There is left ventricular hypertrophy.  Visualized upper abdominal structures are unremarkable. Thyroid appears normal.  Review of the MIP images confirms the above findings.  IMPRESSION: Small peripheral pulmonary embolus in the medial segment right middle lobe. No other pulmonary emboli. Findings concerning for right heart strain. Echocardiography advised. Note that there is left ventricular hypertrophy.  Patient has had recent coil embolization of an arteriovenous malformation in the medial segment of the right middle lobe. A 1 cm nodular opacity adjacent to the right heart border may represent a portion of thrombosed vessel from this procedure. There is no active appearing extravasation of intravenous contrast from this region, although a tiny leak could be obscured by this susceptibility artifact from the coils in this area focally. There is some patchy ground-glass type opacity  which probably represent inflammation from the recent embolization procedure. An early pneumonia in this area is also possible. Hemorrhage from the procedure in this area is a third differential consideration. If hemoptysis persists, repeat CT angiogram chest would be appropriate to assess for the possibility of developing leak or recanalization of vascular malformation.  There is bibasilar atelectatic change.  Critical Value/emergent results were called by telephone at the time of interpretation on 04/07/2014 at 7:32 am to Junius Finner, [place], who verbally acknowledged these results.  Electronically Signed: By: Bretta Bang M.D. 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 The patient presents for arteriographic characterization of the malformation transcatheter embolization.  EXAM: 1. ULTRASOUND GUIDANCE FOR VASCULAR ACCESS OF THE RIGHT COMMON FEMORAL VEIN 2. RIGHT PULMONARY ARTERIOGRAPHY AT THE LEVEL OF THE RIGHT MAIN PULMONARY ARTERY WITH HEMODYNAMIC PRESSURE MEASUREMENT 3. ADDITIONAL SELECTIVE ARTERIOGRAPHY AT THE LEVEL OF THE RIGHT MIDDLE LOBE PULMONARY ARTERY 4. ADDITIONAL SELECTIVE ARTERIOGRAPHY OF A RIGHT MIDDLE LOBE PULMONARY ARTERY BRANCH 5. 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 FOLLOW-UP ANGIOGRAPHY AFTER EMBOLIZATION ASSISTANT:  DR. TONY DEVESHWAR  MEDICATIONS: Anesthesia:  General  1 g IV vancomycin. Vancomycin was given within two hours of incision. Vancomycin was given due to an antibiotic allergy.  4000 units intravenous heparin during the procedure.  CONTRAST:  OMNIPAQUE IOHEXOL 300 MG/ML  SOLN  FLUOROSCOPY TIME:  65 min and 12 seconds.  PROCEDURE: Prior to the procedure, informed consent was obtained from the patient. Details and risks of the procedure were discussed. The patient was placed  under general anesthesia. A time-out procedure was performed.  The right groin was prepped with Betadine and a sterile drape applied. Patency of the right common femoral vein was confirmed by ultrasound. Under direct ultrasound guidance, a 21 gauge needle was advanced into the vein. Access was secured with a micropuncture set. A 7 French sheath was placed over a guidewire.  And angled pigtail catheter was advanced over a guidewire through the inferior vena cava and into the right heart. The catheter was further advanced into the right pulmonary artery. Pulmonary artery pressures were obtained.  Initial pulmonary arteriography was performed through the angled pigtail catheter at the level of the right main pulmonary artery segment. The pigtail catheter was removed over a guidewire. The short 7 French sheath was also removed. A 65 cm, 7 French Arrow sheath was advanced over a wire into the right pulmonary artery.  A 5 [place] JB 1 catheter was advanced into the right middle lobe pulmonary artery and selective arteriography performed. The catheter was further advanced into a third order right middle lobe pulmonary artery branch and selective arteriography performed.  A Rebar micro catheter was then advanced through the 5 French catheter and into a new right middle lobe AV malformation. Arteriography was performed. Transcatheter embolization was then performed utilizing Concerto microcoils advanced through the micro catheter. A total of 10 embolization coils were utilized ranging in diameter from 6 mm up to 8 mm.  Additional deployment was performed in the right middle lobe feeding arterial branch of a 3 mm MVP Micro Vascular Plug. After plug deployment, additional arteriography was performed through the 5 French catheter. Additional right pulmonary artery pressure measurements were also obtained through the sheath.  After ACT was checked, the sheath was removed and hemostasis obtained with manual compression.   COMPLICATIONS: None  FINDINGS: Right pulmonary arteriography demonstrates normal caliber arteries. There is prompt visualization of a tortuous arteriovenous malformation emanating from a right middle lobe pulmonary artery branch and coursing medially and anteriorly with fistulous and dilated connection noted between artery and vein and early venous drainage into an enlarged vein that promptly flows into the left atrium via the inferior pulmonary vein. Flow through the malformation was subjectively high. Initial pulmonary artery pressure was normal and 29/12 mm mercury, mean 20.  The AVM is supplied by a single anterior right middle lobe branch measuring approximately 3 mm in diameter. Tortuous nidus is present anteriorly with communication with a single dominant draining vein. No other vascular malformations are identified in the right lung.  Transcatheter embolization was performed initially at the most dilated segment of the AVM just beyond the arterial portion of the malformation and just in the venous side of the malformation. After deployment of multiple microcoils, flow was slowed considerably in the malformation but some outflow into the draining vein remained. Once a more transversely oriented portion of the supplying artery was accessible after coil deployment, decision was made to deploy a a vascular plug to complete embolization.  After deployment of the MVP vascular plug, the AVM was completely occluded with no  further antegrade flow remaining and no visualization of venous drainage. Postprocedural pulmonary artery pressures were stable and normal measuring 31/12 mm mercury, mean 21.  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 After catheter rising the supplying arterial branch in the right middle lobe, the AVM was successfully embolized and occluded utilizing micro coils followed by a vascular plug. The patient  will be admitted for overnight observation following the procedure.   Electronically Signed   By: [name] M.D.   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 The patient presents for arteriographic characterization of the malformation transcatheter embolization.  EXAM: 1. ULTRASOUND GUIDANCE FOR VASCULAR ACCESS OF THE RIGHT COMMON FEMORAL VEIN 2. RIGHT PULMONARY ARTERIOGRAPHY AT THE LEVEL OF THE RIGHT MAIN PULMONARY ARTERY WITH HEMODYNAMIC PRESSURE MEASUREMENT 3. ADDITIONAL SELECTIVE ARTERIOGRAPHY AT THE LEVEL OF THE RIGHT MIDDLE LOBE PULMONARY ARTERY 4. ADDITIONAL SELECTIVE ARTERIOGRAPHY OF A RIGHT MIDDLE LOBE PULMONARY ARTERY BRANCH 5. 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 FOLLOW-UP ANGIOGRAPHY AFTER EMBOLIZATION ASSISTANT:  DR. TONY DEVESHWAR  MEDICATIONS: Anesthesia: General  1 g IV vancomycin. Vancomycin was given within two hours of incision. Vancomycin was given due to an antibiotic allergy.  4000 units intravenous heparin during the procedure.  CONTRAST:  OMNIPAQUE IOHEXOL 300 MG/ML  SOLN  FLUOROSCOPY TIME:  65 min and 12 seconds.  PROCEDURE: Prior to the procedure, informed consent was obtained from the patient. Details and risks of the procedure were discussed. The patient was placed under general anesthesia. A time-out procedure was performed.  The right groin was prepped with Betadine and a sterile drape applied. Patency of the right common femoral vein was confirmed by ultrasound. Under direct ultrasound guidance, a 21 gauge needle was advanced into the vein. Access was secured with a micropuncture set. A 7 French sheath was placed over a guidewire.  And angled pigtail catheter was advanced over a guidewire through the inferior vena cava and into the right heart. The catheter was further  advanced into the right pulmonary artery. Pulmonary artery pressures were obtained.  Initial pulmonary arteriography was performed through the angled pigtail catheter at the level of the right main pulmonary artery segment. The pigtail catheter was removed over a guidewire. The short 7 French sheath was also removed. A 65 cm, 7 French Arrow sheath was advanced over a wire into the right pulmonary artery.  A 5 [place] JB 1 catheter was advanced into the right middle lobe pulmonary artery and selective arteriography performed. The catheter was further advanced into a third order right middle lobe pulmonary artery branch and selective arteriography performed.  A Rebar micro catheter was then advanced through the 5 French catheter and into a new right middle lobe AV malformation. Arteriography was performed. Transcatheter embolization was then performed utilizing Concerto microcoils advanced through the micro catheter. A total of 10 embolization coils were utilized ranging in diameter from 6 mm up to 8 mm.  Additional deployment was performed in the right middle lobe feeding arterial branch of a 3 mm MVP Micro Vascular Plug. After plug deployment, additional arteriography was performed through the 5 French catheter. Additional right pulmonary artery pressure measurements were also obtained through the sheath.  After ACT was checked, the sheath was removed and hemostasis obtained with manual compression.  COMPLICATIONS:  None  FINDINGS: Right pulmonary arteriography demonstrates normal caliber arteries. There is prompt visualization of a tortuous arteriovenous malformation emanating from a right middle lobe pulmonary artery branch and coursing medially and anteriorly with fistulous and dilated connection noted between artery and vein and early venous drainage into an enlarged vein that promptly flows into the left atrium via the inferior pulmonary vein. Flow through the malformation was subjectively high. Initial pulmonary  artery pressure was normal and 29/12 mm mercury, mean 20.  The AVM is supplied by a single anterior right middle lobe branch measuring approximately 3 mm in diameter. Tortuous nidus is present anteriorly with communication with a single dominant draining vein. No other vascular malformations are identified in the right lung.  Transcatheter embolization was performed initially at the most dilated segment of the AVM just beyond the arterial portion of the malformation and just in the venous side of the malformation. After deployment of multiple microcoils, flow was slowed considerably in the malformation but some outflow into the draining vein remained. Once a more transversely oriented portion of the supplying artery was accessible after coil deployment, decision was made to deploy a a vascular plug to complete embolization.  After deployment of the MVP vascular plug, the AVM was completely occluded with no further antegrade flow remaining and no visualization of venous drainage. Postprocedural pulmonary artery pressures were stable and normal measuring 31/12 mm mercury, mean 21.  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 After catheter rising the supplying arterial branch in the right middle lobe, the AVM was successfully embolized and occluded utilizing micro coils followed by a vascular plug. The patient will be admitted for overnight observation following the procedure.   Electronically Signed   By: [name] M.D.   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 The patient presents for arteriographic characterization of the malformation transcatheter embolization.  EXAM: 1. ULTRASOUND GUIDANCE FOR VASCULAR ACCESS OF THE RIGHT COMMON FEMORAL VEIN 2. RIGHT  PULMONARY ARTERIOGRAPHY AT THE LEVEL OF THE RIGHT MAIN PULMONARY ARTERY WITH HEMODYNAMIC PRESSURE MEASUREMENT 3. ADDITIONAL SELECTIVE ARTERIOGRAPHY AT THE LEVEL OF THE RIGHT MIDDLE LOBE PULMONARY ARTERY 4. ADDITIONAL SELECTIVE ARTERIOGRAPHY OF A RIGHT MIDDLE LOBE PULMONARY ARTERY BRANCH 5. 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 FOLLOW-UP ANGIOGRAPHY AFTER EMBOLIZATION ASSISTANT:  DR. TONY DEVESHWAR  MEDICATIONS: Anesthesia: General  1 g IV vancomycin. Vancomycin was given within two hours of incision. Vancomycin was given due to an antibiotic allergy.  4000 units intravenous heparin during the procedure.  CONTRAST:  OMNIPAQUE IOHEXOL 300 MG/ML  SOLN  FLUOROSCOPY TIME:  65 min and 12 seconds.  PROCEDURE: Prior to the procedure, informed consent was obtained from the patient. Details and risks of the procedure were discussed. The patient was placed under general anesthesia. A time-out procedure was performed.  The right groin was prepped with Betadine and a sterile drape applied. Patency of the right common femoral vein was confirmed by ultrasound. Under direct ultrasound guidance, a 21 gauge needle was advanced into the vein. Access was secured with a micropuncture set. A 7 French sheath was placed over a guidewire.  And angled pigtail catheter was advanced over a guidewire through the inferior vena cava and into the right heart. The catheter was further advanced into the right pulmonary  artery. Pulmonary artery pressures were obtained.  Initial pulmonary arteriography was performed through the angled pigtail catheter at the level of the right main pulmonary artery segment. The pigtail catheter was removed over a guidewire. The short 7 French sheath was also removed. A 65 cm, 7 French Arrow sheath was advanced over a wire into the right pulmonary artery.  A 5 [place] JB 1 catheter was advanced into the right middle lobe pulmonary artery and selective arteriography  performed. The catheter was further advanced into a third order right middle lobe pulmonary artery branch and selective arteriography performed.  A Rebar micro catheter was then advanced through the 5 French catheter and into a new right middle lobe AV malformation. Arteriography was performed. Transcatheter embolization was then performed utilizing Concerto microcoils advanced through the micro catheter. A total of 10 embolization coils were utilized ranging in diameter from 6 mm up to 8 mm.  Additional deployment was performed in the right middle lobe feeding arterial branch of a 3 mm MVP Micro Vascular Plug. After plug deployment, additional arteriography was performed through the 5 French catheter. Additional right pulmonary artery pressure measurements were also obtained through the sheath.  After ACT was checked, the sheath was removed and hemostasis obtained with manual compression.  COMPLICATIONS: None  FINDINGS: Right pulmonary arteriography demonstrates normal caliber arteries. There is prompt visualization of a tortuous arteriovenous malformation emanating from a right middle lobe pulmonary artery branch and coursing medially and anteriorly with fistulous and dilated connection noted between artery and vein and early venous drainage into an enlarged vein that promptly flows into the left atrium via the inferior pulmonary vein. Flow through the malformation was subjectively high. Initial pulmonary artery pressure was normal and 29/12 mm mercury, mean 20.  The AVM is supplied by a single anterior right middle lobe branch measuring approximately 3 mm in diameter. Tortuous nidus is present anteriorly with communication with a single dominant draining vein. No other vascular malformations are identified in the right lung.  Transcatheter embolization was performed initially at the most dilated segment of the AVM just beyond the arterial portion of the malformation and just in the venous side of the  malformation. After deployment of multiple microcoils, flow was slowed considerably in the malformation but some outflow into the draining vein remained. Once a more transversely oriented portion of the supplying artery was accessible after coil deployment, decision was made to deploy a a vascular plug to complete embolization.  After deployment of the MVP vascular plug, the AVM was completely occluded with no further antegrade flow remaining and no visualization of venous drainage. Postprocedural pulmonary artery pressures were stable and normal measuring 31/12 mm mercury, mean 21.  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 After catheter rising the supplying arterial branch in the right middle lobe, the AVM was successfully embolized and occluded utilizing micro coils followed by a vascular plug. The patient will be admitted for overnight observation following the procedure.   Electronically Signed   By: [name] M.D.   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 The patient presents for arteriographic characterization of the malformation transcatheter embolization.  EXAM: 1. ULTRASOUND GUIDANCE FOR VASCULAR ACCESS OF THE RIGHT COMMON FEMORAL VEIN 2. RIGHT PULMONARY ARTERIOGRAPHY AT THE LEVEL OF THE RIGHT MAIN PULMONARY ARTERY WITH  HEMODYNAMIC PRESSURE MEASUREMENT 3. ADDITIONAL SELECTIVE ARTERIOGRAPHY AT THE LEVEL OF THE RIGHT MIDDLE LOBE PULMONARY ARTERY 4. ADDITIONAL SELECTIVE ARTERIOGRAPHY OF A RIGHT MIDDLE LOBE PULMONARY ARTERY BRANCH 5. 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 FOLLOW-UP ANGIOGRAPHY AFTER EMBOLIZATION ASSISTANT:  DR. TONY DEVESHWAR  MEDICATIONS: Anesthesia: General  1 g IV vancomycin. Vancomycin was given  within two hours of incision. Vancomycin was given due to an antibiotic allergy.  4000 units intravenous heparin during the procedure.  CONTRAST:  OMNIPAQUE IOHEXOL 300 MG/ML  SOLN  FLUOROSCOPY TIME:  65 min and 12 seconds.  PROCEDURE: Prior to the procedure, informed consent was obtained from the patient. Details and risks of the procedure were discussed. The patient was placed under general anesthesia. A time-out procedure was performed.  The right groin was prepped with Betadine and a sterile drape applied. Patency of the right common femoral vein was confirmed by ultrasound. Under direct ultrasound guidance, a 21 gauge needle was advanced into the vein. Access was secured with a micropuncture set. A 7 French sheath was placed over a guidewire.  And angled pigtail catheter was advanced over a guidewire through the inferior vena cava and into the right heart. The catheter was further advanced into the right pulmonary artery. Pulmonary artery pressures were obtained.  Initial pulmonary arteriography was performed through the angled pigtail catheter at the level of the right main pulmonary artery segment. The pigtail catheter was removed over a guidewire. The short 7 French sheath was also removed. A 65 cm, 7 French Arrow sheath was advanced over a wire into the right pulmonary artery.  A 5 [place] JB 1 catheter was advanced into the right middle lobe pulmonary artery and selective arteriography performed. The catheter was further advanced into a third order right middle lobe pulmonary artery branch and selective arteriography performed.  A Rebar micro catheter was then advanced through the 5 French catheter and into a new right middle lobe AV malformation. Arteriography was performed. Transcatheter embolization was then performed utilizing Concerto microcoils advanced through the micro catheter. A total of 10 embolization coils were utilized ranging in diameter from 6 mm up to 8 mm.  Additional deployment was  performed in the right middle lobe feeding arterial branch of a 3 mm MVP Micro Vascular Plug. After plug deployment, additional arteriography was performed through the 5 French catheter. Additional right pulmonary artery pressure measurements were also obtained through the sheath.  After ACT was checked, the sheath was removed and hemostasis obtained with manual compression.  COMPLICATIONS: None  FINDINGS: Right pulmonary arteriography demonstrates normal caliber arteries. There is prompt visualization of a tortuous arteriovenous malformation emanating from a right middle lobe pulmonary artery branch and coursing medially and anteriorly with fistulous and dilated connection noted between artery and vein and early venous drainage into an enlarged vein that promptly flows into the left atrium via the inferior pulmonary vein. Flow through the malformation was subjectively high. Initial pulmonary artery pressure was normal and 29/12 mm mercury, mean 20.  The AVM is supplied by a single anterior right middle lobe branch measuring approximately 3 mm in diameter. Tortuous nidus is present anteriorly with communication with a single dominant draining vein. No other vascular malformations are identified in the right lung.  Transcatheter embolization was performed initially at the most dilated segment of the AVM just beyond the arterial portion of the malformation and just in the venous side of the malformation. After deployment of multiple microcoils, flow was slowed considerably in  the malformation but some outflow into the draining vein remained. Once a more transversely oriented portion of the supplying artery was accessible after coil deployment, decision was made to deploy a a vascular plug to complete embolization.  After deployment of the MVP vascular plug, the AVM was completely occluded with no further antegrade flow remaining and no visualization of venous drainage. Postprocedural pulmonary artery pressures were  stable and normal measuring 31/12 mm mercury, mean 21.  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 After catheter rising the supplying arterial branch in the right middle lobe, the AVM was successfully embolized and occluded utilizing micro coils followed by a vascular plug. The patient will be admitted for overnight observation following the procedure.   Electronically Signed   By: [name] M.D.   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 The patient presents for arteriographic characterization of the malformation transcatheter embolization.  EXAM: 1. ULTRASOUND GUIDANCE FOR VASCULAR ACCESS OF THE RIGHT COMMON FEMORAL VEIN 2. RIGHT PULMONARY ARTERIOGRAPHY AT THE LEVEL OF THE RIGHT MAIN PULMONARY ARTERY WITH HEMODYNAMIC PRESSURE MEASUREMENT 3. ADDITIONAL SELECTIVE ARTERIOGRAPHY AT THE LEVEL OF THE RIGHT MIDDLE LOBE PULMONARY ARTERY 4. ADDITIONAL SELECTIVE ARTERIOGRAPHY OF A RIGHT MIDDLE LOBE PULMONARY ARTERY BRANCH 5. 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 FOLLOW-UP ANGIOGRAPHY AFTER EMBOLIZATION ASSISTANT:  DR. TONY DEVESHWAR  MEDICATIONS: Anesthesia: General  1 g IV vancomycin. Vancomycin was given within two hours of incision. Vancomycin was given due to an antibiotic allergy.  4000 units intravenous heparin during the procedure.  CONTRAST:  OMNIPAQUE IOHEXOL 300 MG/ML  SOLN  FLUOROSCOPY TIME:  65 min and 12 seconds.  PROCEDURE: Prior to the procedure, informed consent was obtained from the patient. Details and risks of the procedure were discussed. The patient was placed under general anesthesia. A time-out procedure was performed.  The right groin was prepped with Betadine and a sterile drape applied. Patency of the  right common femoral vein was confirmed by ultrasound. Under direct ultrasound guidance, a 21 gauge needle was advanced into the vein. Access was secured with a micropuncture set. A 7 French sheath was placed over a guidewire.  And angled pigtail catheter was advanced over a guidewire through the inferior vena cava and into the right heart. The catheter was further advanced into the right pulmonary artery. Pulmonary artery pressures were obtained.  Initial pulmonary arteriography was performed through the angled pigtail catheter at the level of the right main pulmonary artery segment. The pigtail catheter was removed over a guidewire. The short 7 French sheath was also removed. A 65 cm, 7 French Arrow sheath was advanced over a wire into the right pulmonary artery.  A 5 [place] JB 1 catheter was advanced into the right middle lobe pulmonary artery and selective arteriography performed. The catheter was further advanced into a third order right middle lobe pulmonary artery branch and selective arteriography performed.  A Rebar micro catheter was then advanced through the 5 French catheter and into a new right middle lobe AV malformation. Arteriography was performed. Transcatheter embolization was then performed utilizing Concerto microcoils advanced through the micro catheter. A total of 10 embolization coils were utilized ranging in diameter from 6 mm up to 8 mm.  Additional deployment was performed in the right middle lobe  feeding arterial branch of a 3 mm MVP Micro Vascular Plug. After plug deployment, additional arteriography was performed through the 5 French catheter. Additional right pulmonary artery pressure measurements were also obtained through the sheath.  After ACT was checked, the sheath was removed and hemostasis obtained with manual compression.  COMPLICATIONS: None  FINDINGS: Right pulmonary arteriography demonstrates normal caliber arteries. There is prompt visualization of a tortuous arteriovenous  malformation emanating from a right middle lobe pulmonary artery branch and coursing medially and anteriorly with fistulous and dilated connection noted between artery and vein and early venous drainage into an enlarged vein that promptly flows into the left atrium via the inferior pulmonary vein. Flow through the malformation was subjectively high. Initial pulmonary artery pressure was normal and 29/12 mm mercury, mean 20.  The AVM is supplied by a single anterior right middle lobe branch measuring approximately 3 mm in diameter. Tortuous nidus is present anteriorly with communication with a single dominant draining vein. No other vascular malformations are identified in the right lung.  Transcatheter embolization was performed initially at the most dilated segment of the AVM just beyond the arterial portion of the malformation and just in the venous side of the malformation. After deployment of multiple microcoils, flow was slowed considerably in the malformation but some outflow into the draining vein remained. Once a more transversely oriented portion of the supplying artery was accessible after coil deployment, decision was made to deploy a a vascular plug to complete embolization.  After deployment of the MVP vascular plug, the AVM was completely occluded with no further antegrade flow remaining and no visualization of venous drainage. Postprocedural pulmonary artery pressures were stable and normal measuring 31/12 mm mercury, mean 21.  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 After catheter rising the supplying arterial branch in the right middle lobe, the AVM was successfully embolized and occluded utilizing micro coils followed by a vascular plug. The patient will be admitted for overnight observation following the procedure.   Electronically Signed   By: [name] M.D.   On: 04/04/2014 17:07   Dg Chest  Port 1 View  04/07/2014   CLINICAL DATA:  Acute onset of hemoptysis, status post embolization of pulmonary AVM. Subsequent encounter.  EXAM: PORTABLE CHEST - 1 VIEW  COMPARISON:  Chest radiograph performed 04/05/2014  FINDINGS: The lungs are well-aerated and clear. There is no evidence of focal opacification, pleural effusion or pneumothorax. The patient is status post coiling of pulmonary AVM at the right lung base.  The cardiomediastinal silhouette is within normal limits. No acute osseous abnormalities are seen.  IMPRESSION: No acute cardiopulmonary process seen.   Electronically Signed   By: Roanna Raider M.D.   On: 04/07/2014 06:36   Dg Chest Port 1 View  04/05/2014   CLINICAL DATA:  Hemoptysis  EXAM: PORTABLE CHEST - 1 VIEW  COMPARISON:  04/04/2014  FINDINGS: Vascular coils are again noted within the right middle lobe. The heart size and mediastinal contours are within normal limits. Both lungs are clear. The visualized skeletal structures are unremarkable.  IMPRESSION: 1. No active cardiopulmonary abnormalities.   Electronically Signed   By: Signa Kell M.D.   On: 04/05/2014 08:30   Dg Chest Port 1 View  04/04/2014   CLINICAL DATA:  Hemoptysis.  Post embolization.  EXAM: PORTABLE CHEST - 1 VIEW  COMPARISON:  Chest CT 03/14/2013  FINDINGS: Post coiling of right middle lobe AVM. The  heart size and mediastinal contours are within normal limits. Both lungs are clear. No pneumothorax or pleural effusion. The visualized skeletal structures are unremarkable.  IMPRESSION: Post coiling of right middle lobe AVM.  No acute pulmonary process.   Electronically Signed   By: Rubye Oaks M.D.   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 The patient presents for arteriographic characterization of the malformation transcatheter embolization.   EXAM: 1. ULTRASOUND GUIDANCE FOR VASCULAR ACCESS OF THE RIGHT COMMON FEMORAL VEIN 2. RIGHT PULMONARY ARTERIOGRAPHY AT THE LEVEL OF THE RIGHT MAIN PULMONARY ARTERY WITH HEMODYNAMIC PRESSURE MEASUREMENT 3. ADDITIONAL SELECTIVE ARTERIOGRAPHY AT THE LEVEL OF THE RIGHT MIDDLE LOBE PULMONARY ARTERY 4. ADDITIONAL SELECTIVE ARTERIOGRAPHY OF A RIGHT MIDDLE LOBE PULMONARY ARTERY BRANCH 5. 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 FOLLOW-UP ANGIOGRAPHY AFTER EMBOLIZATION ASSISTANT:  DR. TONY DEVESHWAR  MEDICATIONS: Anesthesia: General  1 g IV vancomycin. Vancomycin was given within two hours of incision. Vancomycin was given due to an antibiotic allergy.  4000 units intravenous heparin during the procedure.  CONTRAST:  OMNIPAQUE IOHEXOL 300 MG/ML  SOLN  FLUOROSCOPY TIME:  65 min and 12 seconds.  PROCEDURE: Prior to the procedure, informed consent was obtained from the patient. Details and risks of the procedure were discussed. The patient was placed under general anesthesia. A time-out procedure was performed.  The right groin was prepped with Betadine and a sterile drape applied. Patency of the right common femoral vein was confirmed by ultrasound. Under direct ultrasound guidance, a 21 gauge needle was advanced into the vein. Access was secured with a micropuncture set. A 7 French sheath was placed over a guidewire.  And angled pigtail catheter was advanced over a guidewire through the inferior vena cava and into the right heart. The catheter was further advanced into the right pulmonary artery. Pulmonary artery pressures were obtained.  Initial pulmonary arteriography was performed through the angled pigtail catheter at the level of the right main pulmonary artery segment. The pigtail catheter was removed over a guidewire. The short 7 French sheath was also removed. A 65 cm, 7 French Arrow sheath was advanced over a wire into the right pulmonary artery.  A 5 [place] JB 1  catheter was advanced into the right middle lobe pulmonary artery and selective arteriography performed. The catheter was further advanced into a third order right middle lobe pulmonary artery branch and selective arteriography performed.  A Rebar micro catheter was then advanced through the 5 French catheter and into a new right middle lobe AV malformation. Arteriography was performed. Transcatheter embolization was then performed utilizing Concerto microcoils advanced through the micro catheter. A total of 10 embolization coils were utilized ranging in diameter from 6 mm up to 8 mm.  Additional deployment was performed in the right middle lobe feeding arterial branch of a 3 mm MVP Micro Vascular Plug. After plug deployment, additional arteriography was performed through the 5 French catheter. Additional right pulmonary artery pressure measurements were also obtained through the sheath.  After ACT was checked, the sheath was removed and hemostasis obtained with manual compression.  COMPLICATIONS: None  FINDINGS: Right pulmonary arteriography demonstrates normal caliber arteries. There is prompt visualization of a tortuous arteriovenous malformation emanating from a right middle lobe pulmonary artery branch and coursing medially and anteriorly with fistulous and dilated connection noted  between artery and vein and early venous drainage into an enlarged vein that promptly flows into the left atrium via the inferior pulmonary vein. Flow through the malformation was subjectively high. Initial pulmonary artery pressure was normal and 29/12 mm mercury, mean 20.  The AVM is supplied by a single anterior right middle lobe branch measuring approximately 3 mm in diameter. Tortuous nidus is present anteriorly with communication with a single dominant draining vein. No other vascular malformations are identified in the right lung.  Transcatheter embolization was performed initially at the most dilated segment of the AVM just  beyond the arterial portion of the malformation and just in the venous side of the malformation. After deployment of multiple microcoils, flow was slowed considerably in the malformation but some outflow into the draining vein remained. Once a more transversely oriented portion of the supplying artery was accessible after coil deployment, decision was made to deploy a a vascular plug to complete embolization.  After deployment of the MVP vascular plug, the AVM was completely occluded with no further antegrade flow remaining and no visualization of venous drainage. Postprocedural pulmonary artery pressures were stable and normal measuring 31/12 mm mercury, mean 21.  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 After catheter rising the supplying arterial branch in the right middle lobe, the AVM was successfully embolized and occluded utilizing micro coils followed by a vascular plug. The patient will be admitted for overnight observation following the procedure.   Electronically Signed   By: [name] M.D.   On: 04/04/2014 17:07    Labs:  CBC:  Recent Labs  03/27/14 1519 04/05/14 0424 04/07/14 0600 04/07/14 0614 04/08/14 0715  WBC 5.9 6.2 5.0  --  5.3  HGB 12.4 10.8* 10.5* 10.9* 10.6*  HCT 37.8 33.6* 33.2* 32.0* 34.3*  PLT 217 121* 173  --  180    COAGS:  Recent Labs  03/27/14 1519  INR 1.18  APTT 40*    BMP:  Recent Labs  03/27/14 1519 04/07/14 0600 04/07/14 0614 04/08/14 0715  NA 143 140 140 145  K 4.2 4.0 3.9 4.4  CL 106 102 102 107  CO2 26 26  --  27  GLUCOSE 98 102* 106* 106*  BUN 16 14 15 16   CALCIUM 9.1 9.2  --  9.1  CREATININE 0.91 0.84 0.90 0.86  GFRNONAA 70* 77*  --  75*  GFRAA 81* 89*  --  87*    LIVER FUNCTION TESTS:  Recent Labs  03/27/14 1519 04/07/14 0600  BILITOT <0.2* <0.2*  AST 26 27  ALT 30 32  ALKPHOS 81 94  PROT 6.8 6.3  ALBUMIN 4.0 3.4*     Assessment and Plan:  Rt Pulmonary AVM embolization 04/04/14 in IR. 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

## 2014-04-08 NOTE — Progress Notes (Signed)
Subjective: Pt still having chest pain that has decreased in severity. Denies any hemoptysis overnight.   Objective: Vital signs in last 24 hours: Filed Vitals:   04/07/14 1537 04/07/14 2113 04/08/14 0513 04/08/14 0711  BP: 121/56 122/71 141/77   Pulse:  64 60   Temp: 98.1 F (36.7 C) 98.9 F (37.2 C) 98.4 F (36.9 C)   TempSrc: Oral Oral Oral   Resp: 20 18 18    Height:      Weight:    139 lb 8.8 oz (63.3 kg)  SpO2: 98% 99% 96%    General: NAD Lungs: CTAB, no wheezes Cardiac: RRR, no murmurs GI: non tender to palpation, active bowel sounds, soft MSK: tender to palpation of chest wall beneath rt breast Neuro: CN 2-12 grossly intact  Lab Results: Basic Metabolic Panel:  Recent Labs Lab 04/07/14 0600 04/07/14 0614 04/08/14 0715  NA 140 140 145  K 4.0 3.9 4.4  CL 102 102 107  CO2 26  --  27  GLUCOSE 102* 106* 106*  BUN 14 15 16   CREATININE 0.84 0.90 0.86  CALCIUM 9.2  --  9.1   Liver Function Tests:  Recent Labs Lab 04/07/14 0600  AST 27  ALT 32  ALKPHOS 94  BILITOT <0.2*  PROT 6.3  ALBUMIN 3.4*    Recent Labs Lab 04/07/14 0600  LIPASE 17   CBC:  Recent Labs Lab 04/07/14 0600 04/07/14 0614 04/08/14 0715  WBC 5.0  --  5.3  NEUTROABS 2.5  --   --   HGB 10.5* 10.9* 10.6*  HCT 33.2* 32.0* 34.3*  MCV 87.6  --  88.9  PLT 173  --  180    Studies/Results: Ct Angio Chest Pe W/cm &/or Wo Cm  04/07/2014   ADDENDUM REPORT: 04/07/2014 08:20  ADDENDUM: The small thrombus in the peripheral right middle lobe medial segment pulmonary artery branch may well be due to the recent embolization; this small thrombus is near one of the embolization coils. Note that other pulmonary arterial vessels elsewhere appear patent and normal. The asymmetry in ventricular diameter is likely due to left ventricular hypertrophy as opposed to potential right heart strain.   Electronically Signed   By: 04/09/2014 M.D.   On: 04/07/2014 08:20   04/07/2014   CLINICAL  DATA:  Difficulty breathing. Hemoptysis. Recent lung embolization for pulmonary arteriovenous malformation  EXAM: CT ANGIOGRAPHY CHEST WITH CONTRAST  TECHNIQUE: Multidetector CT imaging of the chest was performed using the standard protocol during bolus administration of intravenous contrast. Multiplanar CT image reconstructions and MIPs were obtained to evaluate the vascular anatomy.  CONTRAST:  21mL OMNIPAQUE IOHEXOL 350 MG/ML SOLN  COMPARISON:  Chest CT March 14, 2013 and chest radiograph obtained earlier in the day  FINDINGS: There is a small pulmonary embolus in a medial segment right middle lobe branch, best seen on axial slices 172 through 175, series 407. No other pulmonary emboli. There is no thoracic aortic aneurysm or dissection. The right ventricle to left ventricle diameter ratio is greater than 1.0 which raises concern for right heart strain.  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 The arteriovenous malformation appears occluded on this study. No contrast extravasation is appreciable currently in this area. Note that there is some susceptibility artifact from the coils which could obscure a tiny contrast leak. There is some patchy ground-glass type opacity in the medial segment of the right middle lobe in  the vicinity of the recent arteriovenous malformation. On axial slice 68 series 406, there is a focal opacity adjacent to the right heart border measuring 1.0 x 1.1 cm which probably represents the thrombosed nidus of a portion of this vascular malformation.  There is patchy lower lobe atelectasis bilaterally.  There is no appreciable thoracic adenopathy. The pericardium is not thickened. There is left ventricular hypertrophy.  Visualized upper abdominal structures are unremarkable. Thyroid appears normal.  Review of the MIP images confirms the above findings.  IMPRESSION: Small peripheral pulmonary embolus in  the medial segment right middle lobe. No other pulmonary emboli. Findings concerning for right heart strain. Echocardiography advised. Note that there is left ventricular hypertrophy.  Patient has had recent coil embolization of an arteriovenous malformation in the medial segment of the right middle lobe. A 1 cm nodular opacity adjacent to the right heart border may represent a portion of thrombosed vessel from this procedure. There is no active appearing extravasation of intravenous contrast from this region, although a tiny leak could be obscured by this susceptibility artifact from the coils in this area focally. There is some patchy ground-glass type opacity which probably represent inflammation from the recent embolization procedure. An early pneumonia in this area is also possible. Hemorrhage from the procedure in this area is a third differential consideration. If hemoptysis persists, repeat CT angiogram chest would be appropriate to assess for the possibility of developing leak or recanalization of vascular malformation.  There is bibasilar atelectatic change.  Critical Value/emergent results were called by telephone at the time of interpretation on 04/07/2014 at 7:32 am to Junius Finner, [place], who verbally acknowledged these results.  Electronically Signed: By: Bretta Bang M.D. On: 04/07/2014 07:35   Dg Chest Port 1 View  04/07/2014   CLINICAL DATA:  Acute onset of hemoptysis, status post embolization of pulmonary AVM. Subsequent encounter.  EXAM: PORTABLE CHEST - 1 VIEW  COMPARISON:  Chest radiograph performed 04/05/2014  FINDINGS: The lungs are well-aerated and clear. There is no evidence of focal opacification, pleural effusion or pneumothorax. The patient is status post coiling of pulmonary AVM at the right lung base.  The cardiomediastinal silhouette is within normal limits. No acute osseous abnormalities are seen.  IMPRESSION: No acute cardiopulmonary process seen.   Electronically Signed    By: Roanna Raider M.D.   On: 04/07/2014 06:36   Medications: I have reviewed the patient's current medications. Scheduled Meds: . acetaminophen  650 mg Oral Once  . atorvastatin  80 mg Oral Daily  . escitalopram  10 mg Oral Daily   And  . escitalopram  30 mg Oral QHS  . fluorometholone  1 drop Both Eyes BID  . hydroxychloroquine  200 mg Oral TID PC & HS  . memantine  10 mg Oral Daily  . senna-docusate  1 tablet Oral BID  . zonisamide  300 mg Oral BID   Continuous Infusions:  PRN Meds:.HYDROcodone-acetaminophen Assessment/Plan: Principal Problem:   Chest pain, pleuritic Active Problems:   Dissociative identity disorder   Essential hypertension   H/O: CVA (cerebrovascular accident)   Pulmonary arteriovenous malformation  Pulmonary embolism-- small peripheral pulmonary embolus in the medial segment of right middle lobe near site of recent embolization w/coils on 04/04/14 alongw/ LVH CTA. Dr. Lowella Dandy w/ vascular sx recommends no intervention at this time - Previously considering ECHO to investigate rt heart strain vs LVH. However, per Ralene Muskrat w/ vascular sx LVH is not a new finding. Pt is stable for d/c home  today now that hemoptysis has resolved and chest pain has subsided.  - pulmonology consulted in the ED, they will see patient today - pt has follow up in 4 weeks w/ Dr. Fredia Sorrow  Seizures - continue home med zonegran 300mg  BID  HLD - continue home med lipitor 80mg    Depression - continue home med lexaprol  RA - continue home plaquenil 200mg  TID  Constipation -senokot BID  DVT prophylaxis-- SCDs   Dispo: Discharge today or tomorrow.    The patient does have a current PCP Raynelle Jan, MD) and does not need an [facility] hospital follow-up appointment after discharge.  The patient does not have transportation limitations that hinder transportation to clinic appointments.  .Services Needed at time of discharge: Y = Yes, Blank = No PT:   OT:   RN:   Equipment:    Other:     LOS: 1 day   Gara Kroner, MD 04/08/2014, 9:10 AM

## 2014-04-08 NOTE — Discharge Summary (Signed)
Name: Priscilla Duke MRN: [id number] DOB: 1960-11-25 53 y.o. PCP: Priscilla Jan, MD  Date of Admission: 04/07/2014  5:45 AM Date of Discharge: 04/08/2014 Attending Physician: Priscilla Ly, MD  Discharge Diagnosis: Principal Problem:   Chest pain, pleuritic Active Problems:   Essential hypertension   H/O: CVA (cerebrovascular accident)   Pulmonary arteriovenous malformation   Hemoptysis  Discharge Medications:   Medication List    STOP taking these medications        aspirin EC 81 MG tablet     ELIQUIS 5 MG Tabs tablet  Generic drug:  apixaban      TAKE these medications        atorvastatin 80 MG tablet  Commonly known as:  LIPITOR  Take 80 mg by mouth daily.     bisoprolol-hydrochlorothiazide 5-6.25 MG per tablet  Commonly known as:  ZIAC  Take 1 tablet by mouth daily.     doxycycline 100 MG tablet  Commonly known as:  VIBRA-TABS  Take 100 mg by mouth every morning.     EPIPEN 2-PAK 0.3 mg/0.3 mL Devi  Generic drug:  EPINEPHrine  Use as directed     escitalopram 10 MG tablet  Commonly known as:  LEXAPRO  Take 10-30 mg by mouth 2 (two) times daily. 10mg  in the morning and 30mg  in the evening     fish oil-omega-3 fatty acids 1000 MG capsule  Take 1 g by mouth daily.     fluorometholone 0.25 % ophthalmic suspension  Commonly known as:  FML FORTE  Place 1 drop into both eyes 2 (two) times daily.     Hydrocodone-Acetaminophen 5-300 MG Tabs  Take 1-2 tablets by mouth every 4 (four) hours as needed (for pain).     hydroxychloroquine 200 MG tablet  Commonly known as:  PLAQUENIL  Take 200 mg by mouth 4 (four) times daily - after meals and at bedtime.     memantine 10 MG tablet  Commonly known as:  NAMENDA  Take 10 mg by mouth daily.     zonisamide 100 MG capsule  Commonly known as:  ZONEGRAN  Take 300 mg by mouth 2 (two) times daily.        Disposition and follow-up:   Ms.Priscilla Duke was discharged from [facility] in  stable condition.  At the hospital follow up visit please address:  1.  Please ensure hemoptysis has fully resolved and chest pain is improving.   2.  Labs / imaging needed at time of follow-up: none  3.  Pending labs/ test needing follow-up: urine culture.   4. Restart aspirin 81mg  if indicated. It was stopped during this admission per Dr. Benna Dunks with interventional radiology.   Follow-up Appointments:     Follow-up Information    Follow up with [facility] [place]. Schedule an appointment as soon as possible for a visit on 04/15/2014.   Specialty:  Family Medicine   Why:  at 10:15 am for hospital f/u   Contact information:   [address] [place] 04/17/2014 [address] [phone]         Consultations:  Interventional radiology, Pulmonology  Procedures Performed:  Ct Angio Chest Pe W/cm &/or Wo Cm  04/07/2014   ADDENDUM REPORT: 04/07/2014 08:20  ADDENDUM: The small thrombus in the peripheral right middle lobe medial segment pulmonary artery branch may well be due to the recent embolization; this small thrombus is near one of the embolization coils. Note that other pulmonary arterial vessels elsewhere appear  patent and normal. The asymmetry in ventricular diameter is likely due to left ventricular hypertrophy as opposed to potential right heart strain.   Electronically Signed   By: Priscilla Duke M.D.   On: 04/07/2014 08:20   04/07/2014   CLINICAL DATA:  Difficulty breathing. Hemoptysis. Recent lung embolization for pulmonary arteriovenous malformation  EXAM: CT ANGIOGRAPHY CHEST WITH CONTRAST  TECHNIQUE: Multidetector CT imaging of the chest was performed using the standard protocol during bolus administration of intravenous contrast. Multiplanar CT image reconstructions and MIPs were obtained to evaluate the vascular anatomy.  CONTRAST:  70mL OMNIPAQUE IOHEXOL 350 MG/ML SOLN  COMPARISON:  Chest CT March 14, 2013 and chest radiograph obtained earlier in the day  FINDINGS: There  is a small pulmonary embolus in a medial segment right middle lobe branch, best seen on axial slices 172 through 175, series 407. No other pulmonary emboli. There is no thoracic aortic aneurysm or dissection. The right ventricle to left ventricle diameter ratio is greater than 1.0 which raises concern for right heart strain.  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 The arteriovenous malformation appears occluded on this study. No contrast extravasation is appreciable currently in this area. Note that there is some susceptibility artifact from the coils which could obscure a tiny contrast leak. There is some patchy ground-glass type opacity in the medial segment of the right middle lobe in the vicinity of the recent arteriovenous malformation. On axial slice 68 series 406, there is a focal opacity adjacent to the right heart border measuring 1.0 x 1.1 cm which probably represents the thrombosed nidus of a portion of this vascular malformation.  There is patchy lower lobe atelectasis bilaterally.  There is no appreciable thoracic adenopathy. The pericardium is not thickened. There is left ventricular hypertrophy.  Visualized upper abdominal structures are unremarkable. Thyroid appears normal.  Review of the MIP images confirms the above findings.  IMPRESSION: Small peripheral pulmonary embolus in the medial segment right middle lobe. No other pulmonary emboli. Findings concerning for right heart strain. Echocardiography advised. Note that there is left ventricular hypertrophy.  Patient has had recent coil embolization of an arteriovenous malformation in the medial segment of the right middle lobe. A 1 cm nodular opacity adjacent to the right heart border may represent a portion of thrombosed vessel from this procedure. There is no active appearing extravasation of intravenous contrast from this region, although a tiny leak could be  obscured by this susceptibility artifact from the coils in this area focally. There is some patchy ground-glass type opacity which probably represent inflammation from the recent embolization procedure. An early pneumonia in this area is also possible. Hemorrhage from the procedure in this area is a third differential consideration. If hemoptysis persists, repeat CT angiogram chest would be appropriate to assess for the possibility of developing leak or recanalization of vascular malformation.  There is bibasilar atelectatic change.  Critical Value/emergent results were called by telephone at the time of interpretation on 04/07/2014 at 7:32 am to Junius Finner, [place], who verbally acknowledged these results.  Electronically Signed: By: Priscilla Duke M.D. 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 The patient presents for arteriographic characterization of the malformation transcatheter embolization.  EXAM: 1. ULTRASOUND GUIDANCE FOR VASCULAR ACCESS OF THE RIGHT COMMON FEMORAL VEIN 2. RIGHT  PULMONARY ARTERIOGRAPHY AT THE LEVEL OF THE RIGHT MAIN PULMONARY ARTERY WITH HEMODYNAMIC PRESSURE MEASUREMENT 3. ADDITIONAL SELECTIVE ARTERIOGRAPHY AT THE LEVEL OF THE RIGHT MIDDLE LOBE PULMONARY ARTERY 4. ADDITIONAL SELECTIVE ARTERIOGRAPHY OF A RIGHT MIDDLE LOBE PULMONARY ARTERY BRANCH 5. 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 FOLLOW-UP ANGIOGRAPHY AFTER EMBOLIZATION ASSISTANT:  DR. TONY DEVESHWAR  MEDICATIONS: Anesthesia: General  1 g IV vancomycin. Vancomycin was given within two hours of incision. Vancomycin was given due to an antibiotic allergy.  4000 units intravenous heparin during the procedure.  CONTRAST:  OMNIPAQUE IOHEXOL 300 MG/ML  SOLN  FLUOROSCOPY TIME:  65 min and 12 seconds.  PROCEDURE: Prior to the procedure,  informed consent was obtained from the patient. Details and risks of the procedure were discussed. The patient was placed under general anesthesia. A time-out procedure was performed.  The right groin was prepped with Betadine and a sterile drape applied. Patency of the right common femoral vein was confirmed by ultrasound. Under direct ultrasound guidance, a 21 gauge needle was advanced into the vein. Access was secured with a micropuncture set. A 7 French sheath was placed over a guidewire.  And angled pigtail catheter was advanced over a guidewire through the inferior vena cava and into the right heart. The catheter was further advanced into the right pulmonary artery. Pulmonary artery pressures were obtained.  Initial pulmonary arteriography was performed through the angled pigtail catheter at the level of the right main pulmonary artery segment. The pigtail catheter was removed over a guidewire. The short 7 French sheath was also removed. A 65 cm, 7 French Arrow sheath was advanced over a wire into the right pulmonary artery.  A 5 [place] JB 1 catheter was advanced into the right middle lobe pulmonary artery and selective arteriography performed. The catheter was further advanced into a third order right middle lobe pulmonary artery branch and selective arteriography performed.  A Rebar micro catheter was then advanced through the 5 French catheter and into a new right middle lobe AV malformation. Arteriography was performed. Transcatheter embolization was then performed utilizing Concerto microcoils advanced through the micro catheter. A total of 10 embolization coils were utilized ranging in diameter from 6 mm up to 8 mm.  Additional deployment was performed in the right middle lobe feeding arterial branch of a 3 mm MVP Micro Vascular Plug. After plug deployment, additional arteriography was performed through the 5 French catheter. Additional right pulmonary artery pressure measurements were also obtained  through the sheath.  After ACT was checked, the sheath was removed and hemostasis obtained with manual compression.  COMPLICATIONS: None  FINDINGS: Right pulmonary arteriography demonstrates normal caliber arteries. There is prompt visualization of a tortuous arteriovenous malformation emanating from a right middle lobe pulmonary artery branch and coursing medially and anteriorly with fistulous and dilated connection noted between artery and vein and early venous drainage into an enlarged vein that promptly flows into the left atrium via the inferior pulmonary vein. Flow through the malformation was subjectively high. Initial pulmonary artery pressure was normal and 29/12 mm mercury, mean 20.  The AVM is supplied by a single anterior right middle lobe branch measuring approximately 3 mm in diameter. Tortuous nidus is present anteriorly with communication with a single dominant draining vein. No other vascular malformations are identified in the right lung.  Transcatheter embolization was performed initially at the most dilated segment of the AVM just beyond the arterial portion of the malformation and just in the venous side of  the malformation. After deployment of multiple microcoils, flow was slowed considerably in the malformation but some outflow into the draining vein remained. Once a more transversely oriented portion of the supplying artery was accessible after coil deployment, decision was made to deploy a a vascular plug to complete embolization.  After deployment of the MVP vascular plug, the AVM was completely occluded with no further antegrade flow remaining and no visualization of venous drainage. Postprocedural pulmonary artery pressures were stable and normal measuring 31/12 mm mercury, mean 21.  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 After catheter rising the supplying arterial branch in the right middle  lobe, the AVM was successfully embolized and occluded utilizing micro coils followed by a vascular plug. The patient will be admitted for overnight observation following the procedure.   Electronically Signed   By: [name] M.D.   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 The patient presents for arteriographic characterization of the malformation transcatheter embolization.  EXAM: 1. ULTRASOUND GUIDANCE FOR VASCULAR ACCESS OF THE RIGHT COMMON FEMORAL VEIN 2. RIGHT PULMONARY ARTERIOGRAPHY AT THE LEVEL OF THE RIGHT MAIN PULMONARY ARTERY WITH HEMODYNAMIC PRESSURE MEASUREMENT 3. ADDITIONAL SELECTIVE ARTERIOGRAPHY AT THE LEVEL OF THE RIGHT MIDDLE LOBE PULMONARY ARTERY 4. ADDITIONAL SELECTIVE ARTERIOGRAPHY OF A RIGHT MIDDLE LOBE PULMONARY ARTERY BRANCH 5. 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 FOLLOW-UP ANGIOGRAPHY AFTER EMBOLIZATION ASSISTANT:  DR. TONY DEVESHWAR  MEDICATIONS: Anesthesia: General  1 g IV vancomycin. Vancomycin was given within two hours of incision. Vancomycin was given due to an antibiotic allergy.  4000 units intravenous heparin during the procedure.  CONTRAST:  OMNIPAQUE IOHEXOL 300 MG/ML  SOLN  FLUOROSCOPY TIME:  65 min and 12 seconds.  PROCEDURE: Prior to the procedure, informed consent was obtained from the patient. Details and risks of the procedure were discussed. The patient was placed under general anesthesia. A time-out procedure was performed.  The right groin was prepped with Betadine and a sterile drape applied. Patency of the right common femoral vein was confirmed by ultrasound. Under direct ultrasound guidance, a 21 gauge needle was advanced into the vein. Access was secured with a micropuncture set. A 7 French sheath was placed over a guidewire.  And angled pigtail  catheter was advanced over a guidewire through the inferior vena cava and into the right heart. The catheter was further advanced into the right pulmonary artery. Pulmonary artery pressures were obtained.  Initial pulmonary arteriography was performed through the angled pigtail catheter at the level of the right main pulmonary artery segment. The pigtail catheter was removed over a guidewire. The short 7 French sheath was also removed. A 65 cm, 7 French Arrow sheath was advanced over a wire into the right pulmonary artery.  A 5 [place] JB 1 catheter was advanced into the right middle lobe pulmonary artery and selective arteriography performed. The catheter was further advanced into a third order right middle lobe pulmonary artery branch and selective arteriography performed.  A Rebar micro catheter was then advanced through the 5 French catheter and into a new right middle lobe AV malformation. Arteriography was performed. Transcatheter embolization was then performed utilizing Concerto microcoils advanced through the micro catheter. A total of 10 embolization coils were utilized ranging in diameter from 6 mm up to  8 mm.  Additional deployment was performed in the right middle lobe feeding arterial branch of a 3 mm MVP Micro Vascular Plug. After plug deployment, additional arteriography was performed through the 5 French catheter. Additional right pulmonary artery pressure measurements were also obtained through the sheath.  After ACT was checked, the sheath was removed and hemostasis obtained with manual compression.  COMPLICATIONS: None  FINDINGS: Right pulmonary arteriography demonstrates normal caliber arteries. There is prompt visualization of a tortuous arteriovenous malformation emanating from a right middle lobe pulmonary artery branch and coursing medially and anteriorly with fistulous and dilated connection noted between artery and vein and early venous drainage into an enlarged vein that promptly flows  into the left atrium via the inferior pulmonary vein. Flow through the malformation was subjectively high. Initial pulmonary artery pressure was normal and 29/12 mm mercury, mean 20.  The AVM is supplied by a single anterior right middle lobe branch measuring approximately 3 mm in diameter. Tortuous nidus is present anteriorly with communication with a single dominant draining vein. No other vascular malformations are identified in the right lung.  Transcatheter embolization was performed initially at the most dilated segment of the AVM just beyond the arterial portion of the malformation and just in the venous side of the malformation. After deployment of multiple microcoils, flow was slowed considerably in the malformation but some outflow into the draining vein remained. Once a more transversely oriented portion of the supplying artery was accessible after coil deployment, decision was made to deploy a a vascular plug to complete embolization.  After deployment of the MVP vascular plug, the AVM was completely occluded with no further antegrade flow remaining and no visualization of venous drainage. Postprocedural pulmonary artery pressures were stable and normal measuring 31/12 mm mercury, mean 21.  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 After catheter rising the supplying arterial branch in the right middle lobe, the AVM was successfully embolized and occluded utilizing micro coils followed by a vascular plug. The patient will be admitted for overnight observation following the procedure.   Electronically Signed   By: [name] M.D.   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 The patient presents for arteriographic characterization of the malformation  transcatheter embolization.  EXAM: 1. ULTRASOUND GUIDANCE FOR VASCULAR ACCESS OF THE RIGHT COMMON FEMORAL VEIN 2. RIGHT PULMONARY ARTERIOGRAPHY AT THE LEVEL OF THE RIGHT MAIN PULMONARY ARTERY WITH HEMODYNAMIC PRESSURE MEASUREMENT 3. ADDITIONAL SELECTIVE ARTERIOGRAPHY AT THE LEVEL OF THE RIGHT MIDDLE LOBE PULMONARY ARTERY 4. ADDITIONAL SELECTIVE ARTERIOGRAPHY OF A RIGHT MIDDLE LOBE PULMONARY ARTERY BRANCH 5. 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 FOLLOW-UP ANGIOGRAPHY AFTER EMBOLIZATION ASSISTANT:  DR. TONY DEVESHWAR  MEDICATIONS: Anesthesia: General  1 g IV vancomycin. Vancomycin was given within two hours of incision. Vancomycin was given due to an antibiotic allergy.  4000 units intravenous heparin during the procedure.  CONTRAST:  OMNIPAQUE IOHEXOL 300 MG/ML  SOLN  FLUOROSCOPY TIME:  65 min and 12 seconds.  PROCEDURE: Prior to the procedure, informed consent was obtained from the patient. Details and risks of the procedure were discussed. The patient was placed under general anesthesia. A time-out procedure was performed.  The right groin was prepped with Betadine and a sterile drape applied. Patency of the right common  femoral vein was confirmed by ultrasound. Under direct ultrasound guidance, a 21 gauge needle was advanced into the vein. Access was secured with a micropuncture set. A 7 French sheath was placed over a guidewire.  And angled pigtail catheter was advanced over a guidewire through the inferior vena cava and into the right heart. The catheter was further advanced into the right pulmonary artery. Pulmonary artery pressures were obtained.  Initial pulmonary arteriography was performed through the angled pigtail catheter at the level of the right main pulmonary artery segment. The pigtail catheter was removed over a guidewire. The short 7 French sheath was also removed. A 65 cm, 7 French Arrow sheath was advanced over a wire into the right  pulmonary artery.  A 5 [place] JB 1 catheter was advanced into the right middle lobe pulmonary artery and selective arteriography performed. The catheter was further advanced into a third order right middle lobe pulmonary artery branch and selective arteriography performed.  A Rebar micro catheter was then advanced through the 5 French catheter and into a new right middle lobe AV malformation. Arteriography was performed. Transcatheter embolization was then performed utilizing Concerto microcoils advanced through the micro catheter. A total of 10 embolization coils were utilized ranging in diameter from 6 mm up to 8 mm.  Additional deployment was performed in the right middle lobe feeding arterial branch of a 3 mm MVP Micro Vascular Plug. After plug deployment, additional arteriography was performed through the 5 French catheter. Additional right pulmonary artery pressure measurements were also obtained through the sheath.  After ACT was checked, the sheath was removed and hemostasis obtained with manual compression.  COMPLICATIONS: None  FINDINGS: Right pulmonary arteriography demonstrates normal caliber arteries. There is prompt visualization of a tortuous arteriovenous malformation emanating from a right middle lobe pulmonary artery branch and coursing medially and anteriorly with fistulous and dilated connection noted between artery and vein and early venous drainage into an enlarged vein that promptly flows into the left atrium via the inferior pulmonary vein. Flow through the malformation was subjectively high. Initial pulmonary artery pressure was normal and 29/12 mm mercury, mean 20.  The AVM is supplied by a single anterior right middle lobe branch measuring approximately 3 mm in diameter. Tortuous nidus is present anteriorly with communication with a single dominant draining vein. No other vascular malformations are identified in the right lung.  Transcatheter embolization was performed initially at the  most dilated segment of the AVM just beyond the arterial portion of the malformation and just in the venous side of the malformation. After deployment of multiple microcoils, flow was slowed considerably in the malformation but some outflow into the draining vein remained. Once a more transversely oriented portion of the supplying artery was accessible after coil deployment, decision was made to deploy a a vascular plug to complete embolization.  After deployment of the MVP vascular plug, the AVM was completely occluded with no further antegrade flow remaining and no visualization of venous drainage. Postprocedural pulmonary artery pressures were stable and normal measuring 31/12 mm mercury, mean 21.  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 After catheter rising the supplying arterial branch in the right middle lobe, the AVM was successfully embolized and occluded utilizing micro coils followed by a vascular plug. The patient will be admitted for overnight observation following the procedure.   Electronically Signed   By: [name] M.D.   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 The patient presents for arteriographic characterization of the malformation transcatheter embolization.  EXAM: 1. ULTRASOUND GUIDANCE FOR VASCULAR ACCESS OF THE RIGHT COMMON FEMORAL VEIN 2. RIGHT PULMONARY ARTERIOGRAPHY AT THE LEVEL OF THE RIGHT MAIN PULMONARY ARTERY WITH HEMODYNAMIC PRESSURE MEASUREMENT 3. ADDITIONAL SELECTIVE ARTERIOGRAPHY AT THE LEVEL OF THE RIGHT MIDDLE LOBE PULMONARY ARTERY 4. ADDITIONAL SELECTIVE ARTERIOGRAPHY OF A RIGHT MIDDLE LOBE PULMONARY ARTERY BRANCH 5. 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 FOLLOW-UP ANGIOGRAPHY AFTER  EMBOLIZATION ASSISTANT:  DR. TONY DEVESHWAR  MEDICATIONS: Anesthesia: General  1 g IV vancomycin. Vancomycin was given within two hours of incision. Vancomycin was given due to an antibiotic allergy.  4000 units intravenous heparin during the procedure.  CONTRAST:  OMNIPAQUE IOHEXOL 300 MG/ML  SOLN  FLUOROSCOPY TIME:  65 min and 12 seconds.  PROCEDURE: Prior to the procedure, informed consent was obtained from the patient. Details and risks of the procedure were discussed. The patient was placed under general anesthesia. A time-out procedure was performed.  The right groin was prepped with Betadine and a sterile drape applied. Patency of the right common femoral vein was confirmed by ultrasound. Under direct ultrasound guidance, a 21 gauge needle was advanced into the vein. Access was secured with a micropuncture set. A 7 French sheath was placed over a guidewire.  And angled pigtail catheter was advanced over a guidewire through the inferior vena cava and into the right heart. The catheter was further advanced into the right pulmonary artery. Pulmonary artery pressures were obtained.  Initial pulmonary arteriography was performed through the angled pigtail catheter at the level of the right main pulmonary artery segment. The pigtail catheter was removed over a guidewire. The short 7 French sheath was also removed. A 65 cm, 7 French Arrow sheath was advanced over a wire into the right pulmonary artery.  A 5 [place] JB 1 catheter was advanced into the right middle lobe pulmonary artery and selective arteriography performed. The catheter was further advanced into a third order right middle lobe pulmonary artery branch and selective arteriography performed.  A Rebar micro catheter was then advanced through the 5 French catheter and into a new right middle lobe AV malformation. Arteriography was performed. Transcatheter embolization was then performed utilizing Concerto microcoils advanced through the micro  catheter. A total of 10 embolization coils were utilized ranging in diameter from 6 mm up to 8 mm.  Additional deployment was performed in the right middle lobe feeding arterial branch of a 3 mm MVP Micro Vascular Plug. After plug deployment, additional arteriography was performed through the 5 French catheter. Additional right pulmonary artery pressure measurements were also obtained through the sheath.  After ACT was checked, the sheath was removed and hemostasis obtained with manual compression.  COMPLICATIONS: None  FINDINGS: Right pulmonary arteriography demonstrates normal caliber arteries. There is prompt visualization of a tortuous arteriovenous malformation emanating from a right middle lobe pulmonary artery branch and coursing medially and anteriorly with fistulous and dilated connection noted between artery and vein and early venous drainage into an enlarged vein that promptly flows into the left atrium via the inferior pulmonary vein. Flow through the malformation was subjectively high. Initial pulmonary artery pressure was normal and 29/12 mm mercury, mean 20.  The AVM is supplied by a single anterior right middle lobe branch measuring approximately 3 mm in diameter.  Tortuous nidus is present anteriorly with communication with a single dominant draining vein. No other vascular malformations are identified in the right lung.  Transcatheter embolization was performed initially at the most dilated segment of the AVM just beyond the arterial portion of the malformation and just in the venous side of the malformation. After deployment of multiple microcoils, flow was slowed considerably in the malformation but some outflow into the draining vein remained. Once a more transversely oriented portion of the supplying artery was accessible after coil deployment, decision was made to deploy a a vascular plug to complete embolization.  After deployment of the MVP vascular plug, the AVM was completely occluded with  no further antegrade flow remaining and no visualization of venous drainage. Postprocedural pulmonary artery pressures were stable and normal measuring 31/12 mm mercury, mean 21.  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 After catheter rising the supplying arterial branch in the right middle lobe, the AVM was successfully embolized and occluded utilizing micro coils followed by a vascular plug. The patient will be admitted for overnight observation following the procedure.   Electronically Signed   By: [name] M.D.   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 The patient presents for arteriographic characterization of the malformation transcatheter embolization.  EXAM: 1. ULTRASOUND GUIDANCE FOR VASCULAR ACCESS OF THE RIGHT COMMON FEMORAL VEIN 2. RIGHT PULMONARY ARTERIOGRAPHY AT THE LEVEL OF THE RIGHT MAIN PULMONARY ARTERY WITH HEMODYNAMIC PRESSURE MEASUREMENT 3. ADDITIONAL SELECTIVE ARTERIOGRAPHY AT THE LEVEL OF THE RIGHT MIDDLE LOBE PULMONARY ARTERY 4. ADDITIONAL SELECTIVE ARTERIOGRAPHY OF A RIGHT MIDDLE LOBE PULMONARY ARTERY BRANCH 5. 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 FOLLOW-UP ANGIOGRAPHY AFTER EMBOLIZATION ASSISTANT:  DR. TONY DEVESHWAR  MEDICATIONS: Anesthesia: General  1 g IV vancomycin. Vancomycin was given within two hours of incision. Vancomycin was given due to an antibiotic allergy.  4000 units intravenous heparin during the procedure.  CONTRAST:  OMNIPAQUE IOHEXOL 300 MG/ML  SOLN  FLUOROSCOPY TIME:  65 min and 12 seconds.  PROCEDURE: Prior to the procedure, informed consent was obtained from the patient. Details and risks of the procedure were discussed. The patient was placed under general anesthesia. A  time-out procedure was performed.  The right groin was prepped with Betadine and a sterile drape applied. Patency of the right common femoral vein was confirmed by ultrasound. Under direct ultrasound guidance, a 21 gauge needle was advanced into the vein. Access was secured with a micropuncture set. A 7 French sheath was placed over a guidewire.  And angled pigtail catheter was advanced over a guidewire through the inferior vena cava and into the right heart. The catheter was further advanced into the right pulmonary artery. Pulmonary artery pressures were obtained.  Initial pulmonary arteriography was performed through the angled pigtail catheter at the level of the right main pulmonary artery segment. The pigtail catheter was removed over a guidewire. The short 7 French sheath was also removed. A 65 cm, 7 French Arrow sheath was advanced over a wire into the right pulmonary artery.  A 5 [place] JB 1 catheter was advanced into the right middle lobe pulmonary artery and selective arteriography performed. The catheter was further advanced into a third order right middle lobe pulmonary artery branch and selective arteriography performed.  A  Rebar micro catheter was then advanced through the 5 French catheter and into a new right middle lobe AV malformation. Arteriography was performed. Transcatheter embolization was then performed utilizing Concerto microcoils advanced through the micro catheter. A total of 10 embolization coils were utilized ranging in diameter from 6 mm up to 8 mm.  Additional deployment was performed in the right middle lobe feeding arterial branch of a 3 mm MVP Micro Vascular Plug. After plug deployment, additional arteriography was performed through the 5 French catheter. Additional right pulmonary artery pressure measurements were also obtained through the sheath.  After ACT was checked, the sheath was removed and hemostasis obtained with manual compression.  COMPLICATIONS: None  FINDINGS: Right  pulmonary arteriography demonstrates normal caliber arteries. There is prompt visualization of a tortuous arteriovenous malformation emanating from a right middle lobe pulmonary artery branch and coursing medially and anteriorly with fistulous and dilated connection noted between artery and vein and early venous drainage into an enlarged vein that promptly flows into the left atrium via the inferior pulmonary vein. Flow through the malformation was subjectively high. Initial pulmonary artery pressure was normal and 29/12 mm mercury, mean 20.  The AVM is supplied by a single anterior right middle lobe branch measuring approximately 3 mm in diameter. Tortuous nidus is present anteriorly with communication with a single dominant draining vein. No other vascular malformations are identified in the right lung.  Transcatheter embolization was performed initially at the most dilated segment of the AVM just beyond the arterial portion of the malformation and just in the venous side of the malformation. After deployment of multiple microcoils, flow was slowed considerably in the malformation but some outflow into the draining vein remained. Once a more transversely oriented portion of the supplying artery was accessible after coil deployment, decision was made to deploy a a vascular plug to complete embolization.  After deployment of the MVP vascular plug, the AVM was completely occluded with no further antegrade flow remaining and no visualization of venous drainage. Postprocedural pulmonary artery pressures were stable and normal measuring 31/12 mm mercury, mean 21.  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 After catheter rising the supplying arterial branch in the right middle lobe, the AVM was successfully embolized and occluded utilizing micro coils followed by a vascular plug. The patient will be admitted for overnight  observation following the procedure.   Electronically Signed   By: [name] M.D.   On: 04/04/2014 17:07   Dg Chest Port 1 View  04/07/2014   CLINICAL DATA:  Acute onset of hemoptysis, status post embolization of pulmonary AVM. Subsequent encounter.  EXAM: PORTABLE CHEST - 1 VIEW  COMPARISON:  Chest radiograph performed 04/05/2014  FINDINGS: The lungs are well-aerated and clear. There is no evidence of focal opacification, pleural effusion or pneumothorax. The patient is status post coiling of pulmonary AVM at the right lung base.  The cardiomediastinal silhouette is within normal limits. No acute osseous abnormalities are seen.  IMPRESSION: No acute cardiopulmonary process seen.   Electronically Signed   By: Roanna Raider M.D.   On: 04/07/2014 06:36   Dg Chest Port 1 View  04/05/2014   CLINICAL DATA:  Hemoptysis  EXAM: PORTABLE CHEST - 1 VIEW  COMPARISON:  04/04/2014  FINDINGS: Vascular coils are again noted within the right middle lobe. The heart size and mediastinal contours are within normal limits. Both lungs are clear. The visualized skeletal structures are unremarkable.  IMPRESSION: 1. No active cardiopulmonary abnormalities.   Electronically Signed   By: Signa Kell M.D.   On: 04/05/2014 08:30   Dg Chest Port 1 View  04/04/2014   CLINICAL DATA:  Hemoptysis.  Post embolization.  EXAM: PORTABLE CHEST - 1 VIEW  COMPARISON:  Chest CT 03/14/2013  FINDINGS: Post coiling of right middle lobe AVM. The heart size and mediastinal contours are within normal limits. Both lungs are clear. No pneumothorax or pleural effusion. The visualized skeletal structures are unremarkable.  IMPRESSION: Post coiling of right middle lobe AVM.  No acute pulmonary process.   Electronically Signed   By: Rubye Oaks M.D.   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 The patient presents for arteriographic characterization of the malformation transcatheter embolization.  EXAM: 1. ULTRASOUND GUIDANCE FOR VASCULAR ACCESS OF THE RIGHT COMMON FEMORAL VEIN 2. RIGHT PULMONARY ARTERIOGRAPHY AT THE LEVEL OF THE RIGHT MAIN PULMONARY ARTERY WITH HEMODYNAMIC PRESSURE MEASUREMENT 3. ADDITIONAL SELECTIVE ARTERIOGRAPHY AT THE LEVEL OF THE RIGHT MIDDLE LOBE PULMONARY ARTERY 4. ADDITIONAL SELECTIVE ARTERIOGRAPHY OF A RIGHT MIDDLE LOBE PULMONARY ARTERY BRANCH 5. 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 FOLLOW-UP ANGIOGRAPHY AFTER EMBOLIZATION ASSISTANT:  DR. TONY DEVESHWAR  MEDICATIONS: Anesthesia: General  1 g IV vancomycin. Vancomycin was given within two hours of incision. Vancomycin was given due to an antibiotic allergy.  4000 units intravenous heparin during the procedure.  CONTRAST:  OMNIPAQUE IOHEXOL 300 MG/ML  SOLN  FLUOROSCOPY TIME:  65 min and 12 seconds.  PROCEDURE: Prior to the procedure, informed consent was obtained from the patient. Details and risks of the procedure were discussed. The patient was placed under general anesthesia. A time-out procedure was performed.  The right groin was prepped with Betadine and a sterile drape applied. Patency of the right common femoral vein was confirmed by ultrasound. Under direct ultrasound guidance, a 21 gauge needle was advanced into the vein. Access was secured with a micropuncture set. A 7 French sheath was placed over a guidewire.  And angled pigtail catheter was advanced over a guidewire through the inferior vena cava and into the right heart. The catheter was further advanced into the right pulmonary artery. Pulmonary artery pressures were obtained.  Initial pulmonary arteriography was performed through the angled pigtail catheter at the level of the right main pulmonary artery segment. The pigtail catheter was removed over a guidewire. The short 7 French  sheath was also removed. A 65 cm, 7 French Arrow sheath was advanced over a wire into the right pulmonary artery.  A 5 [place] JB 1 catheter was advanced into the right middle lobe pulmonary artery and selective arteriography performed. The catheter was further advanced into a third order right middle lobe pulmonary artery branch and selective arteriography performed.  A Rebar micro catheter was then advanced through the 5 French catheter and into a new right middle lobe AV malformation. Arteriography was performed. Transcatheter embolization was then performed utilizing Concerto microcoils advanced through the micro catheter. A total of 10 embolization coils were utilized ranging in diameter from 6 mm up to 8 mm.  Additional deployment was performed in the right middle lobe feeding arterial branch of a 3 mm MVP Micro Vascular Plug. After plug deployment, additional arteriography was performed through the 5 French catheter. Additional right pulmonary artery pressure  measurements were also obtained through the sheath.  After ACT was checked, the sheath was removed and hemostasis obtained with manual compression.  COMPLICATIONS: None  FINDINGS: Right pulmonary arteriography demonstrates normal caliber arteries. There is prompt visualization of a tortuous arteriovenous malformation emanating from a right middle lobe pulmonary artery branch and coursing medially and anteriorly with fistulous and dilated connection noted between artery and vein and early venous drainage into an enlarged vein that promptly flows into the left atrium via the inferior pulmonary vein. Flow through the malformation was subjectively high. Initial pulmonary artery pressure was normal and 29/12 mm mercury, mean 20.  The AVM is supplied by a single anterior right middle lobe branch measuring approximately 3 mm in diameter. Tortuous nidus is present anteriorly with communication with a single dominant draining vein. No other vascular malformations  are identified in the right lung.  Transcatheter embolization was performed initially at the most dilated segment of the AVM just beyond the arterial portion of the malformation and just in the venous side of the malformation. After deployment of multiple microcoils, flow was slowed considerably in the malformation but some outflow into the draining vein remained. Once a more transversely oriented portion of the supplying artery was accessible after coil deployment, decision was made to deploy a a vascular plug to complete embolization.  After deployment of the MVP vascular plug, the AVM was completely occluded with no further antegrade flow remaining and no visualization of venous drainage. Postprocedural pulmonary artery pressures were stable and normal measuring 31/12 mm mercury, mean 21.  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 After catheter rising the supplying arterial branch in the right middle lobe, the AVM was successfully embolized and occluded utilizing micro coils followed by a vascular plug. The patient will be admitted for overnight observation following the procedure.   Electronically Signed   By: [name] M.D.   On: 04/04/2014 17:07    Admission HPI: Pt is a [name] w/ PMHx of HTN, hx of CVA (12/2013), and pulmonary AVMs w/ recent RML embolization on 04/04/14 who presents to the ED w/ hemoptysis and chest pain. Pt had a AVM in the RML embolized w/ Dr. Fredia Sorrow on 04/04/14. Since the procedure patient has experienced hemoptysis and rt sided chest pain. Pt rates pain as 7/10 in severity that is a sharp pain. Pain is located under rt breast and along diaphragm. Percocet helps reducechest pain to a 4/10. Chest pain is intermittent, exacerbated by movement and associated w/ dyspnea. Pt reports hemoptysis that subsided after her embolization procedure but then worsened Saturday morning. She states  Saturday morning she had black sputum production that was increased from previous days. Sputum color varies from black to green with streaking. Dr. Lowella Dandy is aware pt is admitted. He does not recommend further intervention required at this time, advised not to start anticoagulants.   Hospital Course by problem list: Principal Problem:   Chest pain, pleuritic Active Problems:   Essential hypertension   H/O: CVA (cerebrovascular accident)   Pulmonary arteriovenous malformation   Hemoptysis   1. Hemoptysis and pleuritic right chest pain following transcatheter embolization of a right middle lobe pulmonary AVM on 04/04/2014. As per discussion  with interventional radiologist Dr. Lowella Dandy, he felt the patient's symptoms were more consistent with an infarct near the site of recent procedure and did not recommend anticoagulation, but did recommend pulmonology evaluation.Pulmonology saw patient and suspect that a minor  pulmonary infarct is the result of the second bout of "old blood" that came out per patient. Now has essentially resolved as pt has not had any further episodes of hemoptysis on the next day of admission. Pulmonology felt that bronchoscopy was not indicated at this time. Dr. Miles Costain saw patient on day of discharge and noted pt's symptoms to be an expected post op outcome from recent embolization on 04/04/14. She is to follow up with Dr. Fredia Sorrow in 4 weeks.  Pt was able to ambulate with O2 sats consistently in the high 90's before discharge.   Discharge Vitals:   BP 141/77 mmHg  Pulse 60  Temp(Src) 98.4 F (36.9 C) (Oral)  Resp 18  Ht 5\' 3"  (1.6 m)  Wt 139 lb 8.8 oz (63.3 kg)  BMI 24.73 kg/m2  SpO2 96%  Discharge Labs:  Results for orders placed or performed during the hospital encounter of 04/07/14 (from the past 24 hour(s))  CBC     Status: Abnormal   Collection Time: 04/08/14  7:15 AM  Result Value Ref Range   WBC 5.3 4.0 - 10.5 K/uL   RBC 3.86 (L) 3.87 - 5.11 MIL/uL   Hemoglobin  10.6 (L) 12.0 - 15.0 g/dL   HCT 55.7 (L) 32.2 - 02.5 %   MCV 88.9 78.0 - 100.0 fL   MCH 27.5 26.0 - 34.0 pg   MCHC 30.9 30.0 - 36.0 g/dL   RDW 42.7 06.2 - 37.6 %   Platelets 180 150 - 400 K/uL  Basic metabolic panel     Status: Abnormal   Collection Time: 04/08/14  7:15 AM  Result Value Ref Range   Sodium 145 137 - 147 mEq/L   Potassium 4.4 3.7 - 5.3 mEq/L   Chloride 107 96 - 112 mEq/L   CO2 27 19 - 32 mEq/L   Glucose, Bld 106 (H) 70 - 99 mg/dL   BUN 16 6 - 23 mg/dL   Creatinine, Ser 2.83 0.50 - 1.10 mg/dL   Calcium 9.1 8.4 - 15.1 mg/dL   GFR calc non Af Amer 75 (L) >90 mL/min   GFR calc Af Amer 87 (L) >90 mL/min   Anion gap 11 5 - 15    Signed: Gara Kroner, MD 04/08/2014, 11:42 AM    Services Ordered on Discharge: none Equipment Ordered on Discharge: none

## 2014-04-08 NOTE — Discharge Instructions (Signed)
If you start to cough up bright red blood in the next 3 days or if you notice more bleeding of any color after it completely resolves come back to the hospital immediately.   Hemoptysis Hemoptysis means coughing up blood. The blood may come from the lungs and airways. It can also come from bleeding that occurs outside the lungs and airways. Coughing up blood can be a sign of a minor problem or a serious medical condition.  HOME CARE  Only take medicine as told by your doctor. Do not use medicines that help you stop coughing (cough suppressants) unless your doctor approves.  If you are given antibiotic medicine, take it as told. Finish it even if you start to feel better.  Do not smoke. Also avoid being around others when they are smoking.  Follow up with your doctor as told. GET HELP RIGHT AWAY IF:  You cough up bloody spit (mucus) for longer than a week.  You have a blood-producing cough that is severe or getting worse.  You have a blood-producing cough thatcomes and goes over time.  You have trouble breathing.   You throw up (vomit) blood.  You have bloody or black poop (stool).  You have chest pain.   You have night sweats.  You feel faint or pass out.   You have a fever or lasting symptoms for more than 2-3 days.  You have a fever and your symptoms suddenly get worse. MAKE SURE YOU:  Understand these instructions.  Will watch your condition.  Will get help right away if you are not doing well or get worse. Document Released: 03/22/2012 Document Reviewed: 03/22/2012 [facility] Patient Information 2015 [place], [place]. This information is not intended to replace advice given to you by your health care provider. Make sure you discuss any questions you have with your health care provider.

## 2014-04-08 NOTE — Consult Note (Signed)
Name: Priscilla Duke MRN: [id number] DOB: 1960/10/19    ADMISSION DATE:  04/07/2014 CONSULTATION DATE:  12/21  REFERRING MD :   Priscilla Duke   CHIEF COMPLAINT:   hemoptysis   BRIEF PATIENT DESCRIPTION:  [name] w/ PMHx of HTN, hx of CVA (12/2013), and pulmonary AVMs w/ recent RML embolization on 04/04/14 who presented to the ED w/ hemoptysis and chest pain on 12/20. Pt had a AVM in the RML embolized w/ Dr. Fredia Sorrow on 04/04/14. CT angio was obtained and showed: embolization coils to be in stable position with a tiny amount of thrombus within the embolized pulmonary artery, as expected, parenchyma densities in the medial RML near the embolization coils. Findings are most compatible with pulmonary infarct/inflammation. PCCM was asked to see re: hemoptysis    SIGNIFICANT EVENTS    STUDIES:  CT chest 12/20: The small thrombus in the peripheral right middle lobe medial segment pulmonary artery branch may well be due to the recent embolization; this small thrombus is near one of the embolization coils. Note that other pulmonary arterial vessels elsewhere appear patent and normal. There is some patchyground-glass type opacity which probably represent inflammation from the recent embolization procedure.    HPI [name] w/ PMHx of HTN, hx of CVA (12/2013), and pulmonary AVMs w/ recent RML embolization on 04/04/14 who presents to the ED w/ hemoptysis and chest pain on 12/20. Pt had a AVM in the RML embolized w/ Dr. Fredia Sorrow on 04/04/14. Since the procedure patient had experienced hemoptysis and rt sided chest pain. Pt rated pain as 7/10 in severity that is a sharp pain. Pain located under rt breast and along diaphragm. Percocet helped reduce chest pain to a 4/10. Reported as  intermittent, exacerbated by movement and associated w/ dyspnea. Pt reports hemoptysis initially subsided after her embolization procedure but then worsened 12/19. She stated that morning she had black sputum production that was  increased from previous days. Sputum color varies from black to green with streaking. Dr Lowella Dandy w/ IR reviewed admitting films. CT angio was obtained and showed: embolization coils to be in stable position with a tiny amount of thrombus within the embolized pulmonary artery, as expected, parenchyma densities in the medial RML near the embolization coils. Findings are most compatible with pulmonary infarct/inflammation. PCCM was asked to see re: hemoptysis   PAST MEDICAL HISTORY :   has a past medical history of Hemorrhagic stroke (2006); Didelphic uterus; Colonic polyp; Hypertension; Depression; Hypercholesterolemia; Allergy; IBS (irritable bowel syndrome); Anxiety; GERD (gastroesophageal reflux disease); Inguinal hernia; PONV (postoperative nausea and vomiting); Heart murmur; Shortness of breath dyspnea; Chronic bronchitis; Headache; Migraine; Seizures; Arthritis; Mild cognitive impairment (since 2006 stroke); and Stroke (2006; 01/04/2014).  has past surgical history that includes Excisional hemorrhoidectomy (~ 2009); Abdominoplasty (2007); Colonoscopy w/ biopsies and polypectomy; Anal fissure repair (1981); Radiology with anesthesia (04/04/2014); Augmentation mammaplasty (2007); Breast implant removal (05/2009); Vaginal hysterectomy (2003); Tubal ligation (1998); and Radiology with anesthesia (N/A, 04/04/2014). Prior to Admission medications   Medication Sig Start Date End Date Taking? Authorizing Provider  apixaban (ELIQUIS) 5 MG TABS tablet Take 5 mg by mouth 2 (two) times daily.    Yes Historical Provider, MD  aspirin EC 81 MG tablet Take 81 mg by mouth.   Yes Historical Provider, MD  atorvastatin (LIPITOR) 80 MG tablet Take 80 mg by mouth daily.   Yes Historical Provider, MD  bisoprolol-hydrochlorothiazide (ZIAC) 5-6.25 MG per tablet Take 1 tablet by mouth daily.   Yes Historical Provider, MD  doxycycline (VIBRA-TABS) 100 MG tablet Take 100 mg by mouth every morning. 02/26/14  Yes Historical Provider,  MD  escitalopram (LEXAPRO) 10 MG tablet Take 10-30 mg by mouth 2 (two) times daily. 10mg  in the morning and 30mg  in the evening   Yes Historical Provider, MD  fish oil-omega-3 fatty acids 1000 MG capsule Take 1 g by mouth daily.     Yes Historical Provider, MD  fluorometholone (FML FORTE) 0.25 % ophthalmic suspension Place 1 drop into both eyes 2 (two) times daily.    Yes Historical Provider, MD  Hydrocodone-Acetaminophen 5-300 MG TABS Take 1-2 tablets by mouth every 4 (four) hours as needed (for pain).   Yes Historical Provider, MD  hydroxychloroquine (PLAQUENIL) 200 MG tablet Take 200 mg by mouth 4 (four) times daily - after meals and at bedtime.  04/05/14  Yes Historical Provider, MD  memantine (NAMENDA) 10 MG tablet Take 10 mg by mouth daily.     Yes Historical Provider, MD  zonisamide (ZONEGRAN) 100 MG capsule Take 300 mg by mouth 2 (two) times daily.    Yes Historical Provider, MD  EPINEPHrine (EPIPEN 2-PAK) 0.3 mg/0.3 mL DEVI Use as directed     Historical Provider, MD   Allergies  Allergen Reactions  . Penicillins Shortness Of Breath, Itching and Rash  . Shellfish Allergy Shortness Of Breath and Itching  . Olive Oil Diarrhea and Nausea And Vomiting    FAMILY HISTORY:  family history includes Aneurysm in her sister; Asthma in her brother; Hypertension in her mother and other; Migraines in her mother; Seizures in her father; Stroke in her mother. SOCIAL HISTORY:  reports that she quit smoking about 3 months ago. Her smoking use included Cigarettes and E-cigarettes. She has a 20 pack-year smoking history. She has never used smokeless tobacco. She reports that she drinks about 4.2 oz of alcohol per week. She reports that she does not use illicit drugs.  REVIEW OF SYSTEMS:   Constitutional: Negative for new fever, chills, weight loss, malaise/fatigue and diaphoresis.  HENT: Negative for nosebleeds, congestion, sore throat .  Respiratory: Negative for cough, hemoptysis, sputum production,  shortness of breath, wheezing and stridor.   Cardiovascular: Negative for chest pain, palpitations, orthopnea, claudication, leg swelling and PND.  Gastrointestinal: Negative for heartburn, nausea, vomiting, abdominal pain, diarrhea, constipation, blood in stool and melena.  Genitourinary: Negative for dysuria, urgency, frequency, hematuria and flank pain.  Musculoskeletal: Negative for myalgias, back pain, joint pain and falls.  Skin: Negative for itching and rash.  Neurological: Negative for dizziness, tingling, tremors, sensory change, speech change, focal weakness, seizures, loss of consciousness, weakness and headaches.  Endo/Heme/Allergies: Negative for environmental allergies and polydipsia. Does not bruise/bleed easily.  SUBJECTIVE:  Feels at baseline now.   VITAL SIGNS: Temp:  [98.1 F (36.7 C)-98.9 F (37.2 C)] 98.4 F (36.9 C) (12/21 0513) Pulse Rate:  [56-64] 60 (12/21 0513) Resp:  [18-20] 18 (12/21 0513) BP: (121-159)/(56-77) 141/77 mmHg (12/21 0513) SpO2:  [96 %-99 %] 96 % (12/21 0513) Weight:  [62.052 kg (136 lb 12.8 oz)-63.3 kg (139 lb 8.8 oz)] 63.3 kg (139 lb 8.8 oz) (12/21 0711) Room air   PHYSICAL EXAMINATION: General:  53 year old female, no distress  Neuro: no focal def    HEENT: [place], no JVD:   Cardiovascular:  rrr Lungs:  CTA  Abdomen:  Soft, non-tender  Musculoskeletal:  Intact  Skin:  Intact    Recent Labs Lab 04/07/14 0600 04/07/14 0614 04/08/14 0715  NA 140 140 145  K 4.0 3.9 4.4  CL 102 102 107  CO2 26  --  27  BUN 14 15 16   CREATININE 0.84 0.90 0.86  GLUCOSE 102* 106* 106*    Recent Labs Lab 04/05/14 0424 04/07/14 0600 04/07/14 0614 04/08/14 0715  HGB 10.8* 10.5* 10.9* 10.6*  HCT 33.6* 33.2* 32.0* 34.3*  WBC 6.2 5.0  --  5.3  PLT 121* 173  --  180   Ct Angio Chest Pe W/cm &/or Wo Cm  04/07/2014   ADDENDUM REPORT: 04/07/2014 08:20  ADDENDUM: The small thrombus in the peripheral right middle lobe medial segment pulmonary artery  branch may well be due to the recent embolization; this small thrombus is near one of the embolization coils. Note that other pulmonary arterial vessels elsewhere appear patent and normal. The asymmetry in ventricular diameter is likely due to left ventricular hypertrophy as opposed to potential right heart strain.   Electronically Signed   By: 04/09/2014 M.D.   On: 04/07/2014 08:20   04/07/2014   CLINICAL DATA:  Difficulty breathing. Hemoptysis. Recent lung embolization for pulmonary arteriovenous malformation  EXAM: CT ANGIOGRAPHY CHEST WITH CONTRAST  TECHNIQUE: Multidetector CT imaging of the chest was performed using the standard protocol during bolus administration of intravenous contrast. Multiplanar CT image reconstructions and MIPs were obtained to evaluate the vascular anatomy.  CONTRAST:  34mL OMNIPAQUE IOHEXOL 350 MG/ML SOLN  COMPARISON:  Chest CT March 14, 2013 and chest radiograph obtained earlier in the day  FINDINGS: There is a small pulmonary embolus in a medial segment right middle lobe branch, best seen on axial slices 172 through 175, series 407. No other pulmonary emboli. There is no thoracic aortic aneurysm or dissection. The right ventricle to left ventricle diameter ratio is greater than 1.0 which raises concern for right heart strain.  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 The arteriovenous malformation appears occluded on this study. No contrast extravasation is appreciable currently in this area. Note that there is some susceptibility artifact from the coils which could obscure a tiny contrast leak. There is some patchy ground-glass type opacity in the medial segment of the right middle lobe in the vicinity of the recent arteriovenous malformation. On axial slice 68 series 406, there is a focal opacity adjacent to the right heart border measuring 1.0 x 1.1 cm which probably represents the  thrombosed nidus of a portion of this vascular malformation.  There is patchy lower lobe atelectasis bilaterally.  There is no appreciable thoracic adenopathy. The pericardium is not thickened. There is left ventricular hypertrophy.  Visualized upper abdominal structures are unremarkable. Thyroid appears normal.  Review of the MIP images confirms the above findings.  IMPRESSION: Small peripheral pulmonary embolus in the medial segment right middle lobe. No other pulmonary emboli. Findings concerning for right heart strain. Echocardiography advised. Note that there is left ventricular hypertrophy.  Patient has had recent coil embolization of an arteriovenous malformation in the medial segment of the right middle lobe. A 1 cm nodular opacity adjacent to the right heart border may represent a portion of thrombosed vessel from this procedure. There is no active appearing extravasation of intravenous contrast from this region, although a tiny leak could be obscured by this susceptibility artifact from the coils in this area focally. There is some patchy ground-glass type opacity which probably represent inflammation from the recent embolization procedure. An early pneumonia in this area is also possible. Hemorrhage  from the procedure in this area is a third differential consideration. If hemoptysis persists, repeat CT angiogram chest would be appropriate to assess for the possibility of developing leak or recanalization of vascular malformation.  There is bibasilar atelectatic change.  Critical Value/emergent results were called by telephone at the time of interpretation on 04/07/2014 at 7:32 am to Junius Finner, [place], who verbally acknowledged these results.  Electronically Signed: By: Bretta Bang M.D. On: 04/07/2014 07:35   Dg Chest Port 1 View  04/07/2014   CLINICAL DATA:  Acute onset of hemoptysis, status post embolization of pulmonary AVM. Subsequent encounter.  EXAM: PORTABLE CHEST - 1 VIEW  COMPARISON:   Chest radiograph performed 04/05/2014  FINDINGS: The lungs are well-aerated and clear. There is no evidence of focal opacification, pleural effusion or pneumothorax. The patient is status post coiling of pulmonary AVM at the right lung base.  The cardiomediastinal silhouette is within normal limits. No acute osseous abnormalities are seen.  IMPRESSION: No acute cardiopulmonary process seen.   Electronically Signed   By: Roanna Raider M.D.   On: 04/07/2014 06:36    ASSESSMENT / PLAN:  H/o pulmonary AVM S/p Coiling of RML AVM Post procedure hemoptysis  Ground glass pulmonary infiltrates near area of coils: agree likely represents either early infarct or inflammation   Discussion Feels better. No longer bringing up any blood and CP tolerated. Suspect that this was post-procedural inflammatory related, or early infarct associated w/ coiling. Either way hs essentially resolved so doubt coil is leaking.   Rec/plan No blood thinners Activity restrictions per IR F/u imaging per IR in 2 weeks Nothing else to offer  Simonne Martinet ACNP-BC [facility] Pulmonary/Critical Care Pager # [phone] OR # [phone] if no answer  Patient seen and examined.  I discuss this with her and her husband at length and got the exact timeline, consistency, quality and color of expectorating blood.  I suspect a minor pulmonary infarct is the result of the second bout of "old blood" that came out per patient.  Now has essentially resolved.  No need for bronchoscopy.  This is the naturally progression of this disease process that will be self limited.  I spoke with patient in details that if she is to cough up bright red blood in the next 3 days or if she notice more bleeding of any color after it completely resolves to come back to the hospital immediately.  But for now, no need for bronchoscopy.  Would hold off anti-coagulants at least for the next few wks then may restart if there are other indications (patient is on eliquis  but I am unable to find out why).  But there is certainly no indication from a pulmonary standpoint at this time.  PCCM will sign off, please call back if needed.  Patient seen and examined, agree with above note.  I dictated the care and orders written for this patient under my direction.  Alyson Reedy, MD [phone]    04/08/2014, 9:40 AM

## 2014-04-16 ENCOUNTER — Other Ambulatory Visit (HOSPITAL_COMMUNITY): Payer: Self-pay | Admitting: Interventional Radiology

## 2014-04-16 DIAGNOSIS — R042 Hemoptysis: Secondary | ICD-10-CM

## 2014-04-16 DIAGNOSIS — Q2730 Arteriovenous malformation, site unspecified: Secondary | ICD-10-CM

## 2014-05-08 ENCOUNTER — Ambulatory Visit
Admission: RE | Admit: 2014-05-08 | Discharge: 2014-05-08 | Disposition: A | Discharge status: Home / Self Care | Payer: BLUE CROSS/BLUE SHIELD | Source: Ambulatory Visit | Attending: Radiology | Admitting: Radiology

## 2014-05-08 ENCOUNTER — Ambulatory Visit (HOSPITAL_COMMUNITY)
Admission: RE | Admit: 2014-05-08 | Discharge: 2014-05-08 | Disposition: A | Discharge status: Home / Self Care | Payer: BLUE CROSS/BLUE SHIELD | Source: Ambulatory Visit | Attending: Interventional Radiology | Admitting: Interventional Radiology

## 2014-05-08 DIAGNOSIS — Z09 Encounter for follow-up examination after completed treatment for conditions other than malignant neoplasm: Secondary | ICD-10-CM | POA: Insufficient documentation

## 2014-05-08 DIAGNOSIS — R042 Hemoptysis: Secondary | ICD-10-CM

## 2014-05-08 DIAGNOSIS — Q2572 Congenital pulmonary arteriovenous malformation: Secondary | ICD-10-CM | POA: Insufficient documentation

## 2014-05-08 DIAGNOSIS — Q2730 Arteriovenous malformation, site unspecified: Secondary | ICD-10-CM

## 2014-05-08 HISTORY — PX: IR GENERIC HISTORICAL: IMG1180011

## 2014-05-08 MED ORDER — IOHEXOL 350 MG/ML SOLN
100.0000 mL | Freq: Once | INTRAVENOUS | Status: AC | PRN
  Administered 2014-05-08: 100 mL via INTRAVENOUS

## 2014-05-08 NOTE — Progress Notes (Signed)
Chief Complaint: Chief Complaint  Patient presents with  . Follow-up    6 wk follow up embolizaqtion of Right pulmonary mid lobe AVM    History of Present Illness: Priscilla Duke is a 54 y.o. female status post transcatheter embolization of right middle lobe pulmonary arteriovenous malformation on 04/04/2014. After successful embolization of the right middle lobe AVM utilizing a total of 10 embolization coils and a microvascular plug, the patient did have some intermittent hemoptysis for a few days after the procedure. This has since completely resolved. Some mild right chest discomfort was present initially which has also resolved. The patient has remained off of aspirin and Eliquis since the procedure. She now notes significantly improved overall energy and less dyspnea with exertion. She has had no neurologic symptoms since embolization.  She has noticed a square shaped patch of skin erythema along the lateral aspect of the left breast which developed approximately one week after the procedure. This area is itchy and is also associated with some "burning" discomfort.  The patient has not been applying anything to this region.   Past Medical History  Diagnosis Date  . 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 Didelphic uterus     Dr. Madelaine Etienne  . Colonic polyp   . Hypertension   . Depression   . Hypercholesterolemia   . Allergy     skin, scalp  . IBS (irritable bowel syndrome)   . Anxiety   . GERD (gastroesophageal reflux disease)   . Inguinal hernia     right   . PONV (postoperative nausea and vomiting)     "just after hemorrhoidectomy"  . Heart murmur     as a child and "when I get sick"  . Shortness of breath dyspnea   . Chronic bronchitis     "get it q winter"  . Headache     "at least weekly" (04/04/2014)  . Migraine     "a few times/yr" (04/04/2014)  . Seizures     "none for years now" (04/04/2014)  . Arthritis     "down my entire spine; hands, knees"  (04/04/2014)  . Mild cognitive impairment since 2006 stroke  . Stroke 2006; 01/04/2014    "mild word finding problems since; mild weakness right side" (04/04/2014)    Past Surgical History  Procedure Laterality Date  . Excisional hemorrhoidectomy  ~ 2009  . Abdominoplasty  2007  . Colonoscopy w/ biopsies and polypectomy    . Anal fissure repair  1981    due to tear  . 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 Augmentation mammaplasty  2007  . Breast implant removal  05/2009    w/lift  . Vaginal hysterectomy  2003  . Tubal ligation  1998  . Radiology with anesthesia N/A 04/04/2014    Procedure: RADIOLOGY WITH ANESTHESIA;  Surgeon: Reola Calkins, MD;  Location: [facility] OR;  Service: Radiology;  Laterality: N/A;  DR. Fredia Sorrow PERFORMING    Allergies: Penicillins; Shellfish allergy; and Olive oil  Medications: Prior to Admission medications   Medication Sig Start Date End Date Taking? Authorizing Provider  atorvastatin (LIPITOR) 80 MG tablet Take 80 mg by mouth daily.    Historical Provider, MD  bisoprolol-hydrochlorothiazide [facility]) 5-6.25 MG per tablet Take 1 tablet by mouth daily.    Historical Provider, MD  doxycycline (VIBRA-TABS) 100 MG tablet Take 100 mg by mouth every morning. 02/26/14   Historical Provider,  MD  EPINEPHrine (EPIPEN 2-PAK) 0.3 mg/0.3 mL DEVI Use as directed     Historical Provider, MD  escitalopram (LEXAPRO) 10 MG tablet Take 10-30 mg by mouth 2 (two) times daily. 10mg  in the morning and 30mg  in the evening    Historical Provider, MD  fish oil-omega-3 fatty acids 1000 MG capsule Take 1 g by mouth daily.      Historical Provider, MD  fluorometholone (FML FORTE) 0.25 % ophthalmic suspension Place 1 drop into both eyes 2 (two) times daily.     Historical Provider, MD  Hydrocodone-Acetaminophen 5-300 MG TABS Take 1-2 tablets by mouth every 4 (four) hours as needed (for pain).    Historical Provider, MD    hydroxychloroquine (PLAQUENIL) 200 MG tablet Take 200 mg by mouth 4 (four) times daily - after meals and at bedtime.  04/05/14   Historical Provider, MD  memantine (NAMENDA) 10 MG tablet Take 10 mg by mouth daily.      Historical Provider, MD  zonisamide (ZONEGRAN) 100 MG capsule Take 300 mg by mouth 2 (two) times daily.     Historical Provider, MD    Family History  Problem Relation Age of Onset  . Aneurysm Sister   . Hypertension Other   . Hypertension Mother   . Stroke Mother   . Migraines Mother   . Seizures Father   . Asthma Brother     History   Social History  . Marital Status: Married    Spouse Name: N/A    Number of Children: 5  . Years of Education: N/A   Occupational History  . Disability, former speech pathologist    Social History Main Topics  . Smoking status: Former Smoker -- 1.00 packs/day for 20 years    Types: Cigarettes, E-cigarettes    Quit date: 01/04/2014  . Smokeless tobacco: Never Used  . Alcohol Use: 4.2 oz/week    7 Glasses of wine per week  . Drug Use: No  . Sexual Activity: Yes   Other Topics Concern  . Not on file   Social History Narrative     Review of Systems: A 12 point ROS discussed and pertinent positives are indicated in the HPI above.  All other systems are negative.  Review of Systems  Constitutional: Negative.   Respiratory: Negative.   Cardiovascular: Negative.   Gastrointestinal: Negative.   Genitourinary: Negative.   Neurological: Negative.     Vital Signs: BP 118/57 mmHg  Pulse 60  Temp(Src) 97.9 F (36.6 C) (Oral)  Resp 14  SpO2 99%  Physical Exam  Constitutional: She appears well-developed and well-nourished. No distress.  Cardiovascular: Normal rate, regular rhythm and normal heart sounds.  Exam reveals no gallop and no friction rub.   No murmur heard. Pulmonary/Chest: Effort normal and breath sounds normal. No respiratory distress. She has no wheezes. She has no rales. She exhibits no tenderness.   Skin: Skin is dry. She is not diaphoretic.  5-6 cm square area of skin erythema of skin along lateral aspect of left breast.  No blistering or ulceration.  No raised lesion.    Imaging: Ct Angio Chest Aorta W/cm &/or Wo/cm  05/08/2014   CLINICAL DATA:  Subsequent encounter for embolization of right middle lobe pulmonary AVM.  EXAM: CT ANGIOGRAPHY CHEST WITH CONTRAST  TECHNIQUE: Multidetector CT imaging of the chest was performed using the standard protocol during bolus administration of intravenous contrast. Multiplanar CT image reconstructions and MIPs were obtained to evaluate the vascular anatomy.  CONTRAST:  OMNIPAQUE IOHEXOL 350 MG/ML SOLN  COMPARISON:  04/07/2014  FINDINGS: Mediastinum / Lymph Nodes: No axillary lymphadenopathy. No mediastinal or hilar lymphadenopathy. Heart size is normal. No pericardial.  Lungs / Pleura: Vascular coils are again noted in the anteromedial right middle lobe. No evidence for intravascular pulmonary arterial thrombus today's study. The ground-glass attenuation seen in the region of the vascular coils the previous study has resolved completely in the interval.  No pulmonary edema. No pulmonary parenchymal nodule or. There is some compressive atelectasis in the dependent lower lobes bilaterally.  Right middle lobe pulmonary artery feeding the AVM has decreased substantially in size in the interval. This vessel now measures 2.2 mm in diameter compared to 4.8 mm in diameter on the previous study. The vein draining the embolized AVM appears slightly decreased in the interval measuring 3.5 mm in diameter today (image 51 series 8) compared to 4.6 mm previously.  MSK / Soft Tissues: Bone windows reveal no worrisome lytic or sclerotic osseous lesions.  Upper Abdomen:  Unremarkable.  Review of the MIP images confirms the above findings.  IMPRESSION: Expected appearance after coil embolization of right middle lobe pulmonary AVM. The feeding artery and draining vein both show  interval decrease in size since the prior study. The ground-glass attenuation surrounding the AVM on the previous study has resolved completely.   Electronically Signed   By: Kennith Center M.D.   On: 05/08/2014 10:42    Labs:  CBC:  Recent Labs  03/27/14 1519 04/05/14 0424 04/07/14 0600 04/07/14 0614 04/08/14 0715  WBC 5.9 6.2 5.0  --  5.3  HGB 12.4 10.8* 10.5* 10.9* 10.6*  HCT 37.8 33.6* 33.2* 32.0* 34.3*  PLT 217 121* 173  --  180    COAGS:  Recent Labs  03/27/14 1519  INR 1.18  APTT 40*    BMP:  Recent Labs  03/27/14 1519 04/07/14 0600 04/07/14 0614 04/08/14 0715  NA 143 140 140 145  K 4.2 4.0 3.9 4.4  CL 106 102 102 107  CO2 26 26  --  27  GLUCOSE 98 102* 106* 106*  BUN 16 14 15 16   CALCIUM 9.1 9.2  --  9.1  CREATININE 0.91 0.84 0.90 0.86  GFRNONAA 70* 77*  --  75*  GFRAA 81* 89*  --  87*    LIVER FUNCTION TESTS:  Recent Labs  03/27/14 1519 04/07/14 0600  BILITOT <0.2* <0.2*  AST 26 27  ALT 30 32  ALKPHOS 81 94  PROT 6.8 6.3  ALBUMIN 4.0 3.4*    TUMOR MARKERS: No results for input(s): AFPTM, CEA, CA199, CHROMGRNA in the last 8760 hours.  Assessment and Plan:  Mrs. Payson is doing very well status post percutaneous embolization of a right middle lobe pulmonary AVM. Initial transient hemoptysis has resolved. The patient has noted significant improvement in overall energy and diminished dyspnea with exertion. She has had no neurologic symptoms since the procedure.  I personally reviewed the follow up CTA performed today.  I reviewed imaging directly with Mrs. Orchard and her husband.  The CTA examination demonstrates complete occlusion of the right middle lobe branch supplying the embolized AVM. There is no further opacification of an enlarged early draining vein. Given the excellent appearance by CTA as well as significant clinical improvement in symptoms, I have recommended performing another follow-up CTA in 2 years. I told Mrs. Sanda to  contact me should she develop any new concerning symptoms before that time.  Regarding the geographic region of  skin erythema of the lateral left breast, this area was not in the field of fluoroscopy for treatment of the pulmonary AVM. This appears to most likely be related to a skin reaction potentially from an adhesive cardiac monitoring electrode.  I initially recommended topical use of over-the-counter hydrocortisone cream. I recommended that the patient seek the opinion of a dermatologist if the region of skin erythema and irritation does not resolve or worsens.  I spent a total of 30 minutes face to face in clinical consultation, greater than 50% of which was counseling/coordinating care for embolization of right lung AVM.  Jodi Marble. Fredia Sorrow, M.D. Pager:  [phone]    Signed: [name] T 05/08/2014, 1:48 PM

## 2014-05-21 ENCOUNTER — Telehealth: Payer: Self-pay | Admitting: Emergency Medicine

## 2014-05-21 ENCOUNTER — Ambulatory Visit (HOSPITAL_COMMUNITY)
Admission: RE | Admit: 2014-05-21 | Discharge: 2014-05-21 | Disposition: A | Discharge status: Home / Self Care | Payer: BLUE CROSS/BLUE SHIELD | Source: Ambulatory Visit | Attending: Interventional Radiology | Admitting: Interventional Radiology

## 2014-05-21 ENCOUNTER — Other Ambulatory Visit (HOSPITAL_COMMUNITY): Payer: Self-pay | Admitting: Interventional Radiology

## 2014-05-21 ENCOUNTER — Encounter (HOSPITAL_COMMUNITY): Payer: Self-pay

## 2014-05-21 DIAGNOSIS — R0600 Dyspnea, unspecified: Secondary | ICD-10-CM

## 2014-05-21 DIAGNOSIS — R42 Dizziness and giddiness: Secondary | ICD-10-CM | POA: Diagnosis not present

## 2014-05-21 DIAGNOSIS — R0602 Shortness of breath: Secondary | ICD-10-CM | POA: Diagnosis present

## 2014-05-21 DIAGNOSIS — I280 Arteriovenous fistula of pulmonary vessels: Secondary | ICD-10-CM | POA: Diagnosis not present

## 2014-05-21 DIAGNOSIS — Q2572 Congenital pulmonary arteriovenous malformation: Secondary | ICD-10-CM

## 2014-05-21 MED ORDER — IOHEXOL 350 MG/ML SOLN
100.0000 mL | Freq: Once | INTRAVENOUS | Status: AC | PRN
  Administered 2014-05-21: 100 mL via INTRAVENOUS

## 2014-05-21 NOTE — Telephone Encounter (Signed)
Pt call w/ c/c/ of SOB becoming worse over the last 2 weeks, feeling faint/dizzy.  Her neuro put her on ZPac and she finished that over a week ago due to coughing and not feeling well.  Stated the Dr didn't want to take a chance with her Lungs. " feels like a rod is sticking through to my back" on deep inspirations.   " feels like a choking feeling when lying down.," so she has been sleeping with 6 pillows under her head. Denies fever, sweat, chills, or coughing up phlem or blood.   Dr Fredia Sorrow wants pt to have CTA- Chest today.  Putting pt in for STAT CT at [facility] today at 4pm- pt is aware of the above.

## 2014-05-27 ENCOUNTER — Other Ambulatory Visit: Payer: BLUE CROSS/BLUE SHIELD

## 2014-05-27 ENCOUNTER — Encounter (INDEPENDENT_AMBULATORY_CARE_PROVIDER_SITE_OTHER): Payer: Self-pay

## 2014-05-27 ENCOUNTER — Ambulatory Visit (INDEPENDENT_AMBULATORY_CARE_PROVIDER_SITE_OTHER): Payer: BLUE CROSS/BLUE SHIELD | Admitting: Internal Medicine

## 2014-05-27 ENCOUNTER — Encounter: Payer: Self-pay | Admitting: Internal Medicine

## 2014-05-27 VITALS — BP 110/76 | HR 62 | Ht 63.0 in | Wt 143.0 lb

## 2014-05-27 DIAGNOSIS — Z8673 Personal history of transient ischemic attack (TIA), and cerebral infarction without residual deficits: Secondary | ICD-10-CM

## 2014-05-27 DIAGNOSIS — J302 Other seasonal allergic rhinitis: Secondary | ICD-10-CM

## 2014-05-27 DIAGNOSIS — J309 Allergic rhinitis, unspecified: Secondary | ICD-10-CM

## 2014-05-27 DIAGNOSIS — R042 Hemoptysis: Secondary | ICD-10-CM

## 2014-05-27 DIAGNOSIS — R0781 Pleurodynia: Secondary | ICD-10-CM

## 2014-05-27 DIAGNOSIS — I280 Arteriovenous fistula of pulmonary vessels: Secondary | ICD-10-CM

## 2014-05-27 DIAGNOSIS — J42 Unspecified chronic bronchitis: Secondary | ICD-10-CM | POA: Insufficient documentation

## 2014-05-27 DIAGNOSIS — J3089 Other allergic rhinitis: Secondary | ICD-10-CM

## 2014-05-27 DIAGNOSIS — Q2572 Congenital pulmonary arteriovenous malformation: Secondary | ICD-10-CM

## 2014-05-27 DIAGNOSIS — J410 Simple chronic bronchitis: Secondary | ICD-10-CM

## 2014-05-27 NOTE — Assessment & Plan Note (Signed)
This developed after her coiling procedure but has resolved.

## 2014-05-27 NOTE — Assessment & Plan Note (Signed)
She has been off of cigarettes long enough that an irritant rhinitis should be clearing. She wants to understand how much is allergy. Plan-allergy profile

## 2014-05-27 NOTE — Assessment & Plan Note (Signed)
Long-time cigarette smoke exposure. Cough does not seem significant now and chest exam was unremarkable. Plan-schedule PFT, allergy profile

## 2014-05-27 NOTE — Assessment & Plan Note (Signed)
Unclear mechanism but she associates this to discomfort with her coiling procedure. No pulmonary embolism was identified. This might be chest wall pain.

## 2014-05-27 NOTE — Assessment & Plan Note (Signed)
Postprocedure hemoptysis has stopped but she still notices some pleuritic right anterior chest pain and when supine, a "heavy" sensation on her anterior chest

## 2014-05-27 NOTE — Progress Notes (Signed)
[birth year]/ [birth year]- 53 yoF former smoker referred courtesy of Dr. Yamagata/radiology for pulmonary management after coiling of a pulmonary venous malformation. 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 She had had a previous CVA in 2006 no known DVT or clot source. In December 2015 had coiling of her venous malformation. Over the next several weeks she became more aware of shortness of breath with exertion and increased cough. There was transient hemoptysis after the coiling procedure, now resolved. Still occasional pleuritic pain in right mid chest beneath the breast. Lying supine, she feels a pressure on her chest. History of bronchitis since childhood. Parents it smoked. Brother had asthma she denies history of pneumonia.  Pt c/o sob, chest tightness and throat tickle cough at night.  Being followed for question of lupus. Much sinus congestion with no surgery. Says she is allergic to shrimp, cats. Never evaluated. GERD controlled with diet. Environment: House-gas heat, no indoor smokers, dog indoors, no mold. CTa chest    05/21/14- IMPRESSION: 1. No evidence of acute pulmonary embolus. 2. Right middle lobe arteriovenous malformation coiling. Electronically Signed  By: Elige Ko  On: 05/21/2014 16:44  Prior to Admission medications   Medication Sig Start Date End Date Taking? Authorizing Provider  atorvastatin (LIPITOR) 80 MG tablet Take 80 mg by mouth daily.   Yes Historical Provider, MD  bisoprolol-hydrochlorothiazide (ZIAC) 5-6.25 MG per tablet Take 1 tablet by mouth daily.   Yes Historical Provider, MD  desonide (DESOWEN) 0.05 % ointment Apply 1 application topically 3 (three) times daily. 05/09/14  Yes Historical Provider, MD  EPINEPHrine (EPIPEN 2-PAK) 0.3 mg/0.3 mL DEVI Use as directed    Yes Historical Provider, MD  escitalopram (LEXAPRO) 10 MG tablet Take 10-30 mg by mouth 2 (two) times daily. 10mg  in the morning and 30mg   in the evening   Yes Historical Provider, MD  fluorometholone (FML FORTE) 0.25 % ophthalmic suspension Place 1 drop into both eyes 2 (two) times daily.    Yes Historical Provider, MD  memantine (NAMENDA) 10 MG tablet Take 10 mg by mouth daily.     Yes Historical Provider, MD  zonisamide (ZONEGRAN) 100 MG capsule Take 300 mg by mouth 2 (two) times daily.    Yes Historical Provider, MD  VENTOLIN HFA 108 (90 BASE) MCG/ACT inhaler Inhale 2 puffs into the lungs every 4 (four) hours as needed. 04/16/14   Historical Provider, MD   Past Medical History  Diagnosis Date  . 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 Didelphic uterus     Dr. 04/18/14  . Colonic polyp   . Hypertension   . Depression   . Hypercholesterolemia   . Allergy     skin, scalp  . IBS (irritable bowel syndrome)   . Anxiety   . GERD (gastroesophageal reflux disease)   . Inguinal hernia     right   . PONV (postoperative nausea and vomiting)     "just after hemorrhoidectomy"  . Heart murmur     as a child and "when I get sick"  . Shortness of breath dyspnea   . Chronic bronchitis     "get it q winter"  . Headache     "at least weekly" (04/04/2014)  . Migraine     "a few times/yr" (04/04/2014)  . Seizures     "none for years now" (04/04/2014)  . Arthritis     "down my entire spine; hands, knees" (  04/04/2014)  . Mild cognitive impairment since 2006 stroke  . Stroke 2006; 01/04/2014    "mild word finding problems since; mild weakness right side" (04/04/2014)   Past Surgical History  Procedure Laterality Date  . Excisional hemorrhoidectomy  ~ 2009  . Abdominoplasty  2007  . Colonoscopy w/ biopsies and polypectomy    . Anal fissure repair  1981    due to tear  . 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 Augmentation mammaplasty  2007  . Breast implant removal  05/2009    w/lift  . Vaginal hysterectomy  2003  . Tubal ligation  1998  . Radiology  with anesthesia N/A 04/04/2014    Procedure: RADIOLOGY WITH ANESTHESIA;  Surgeon: Reola Calkins, MD;  Location: [facility] OR;  Service: Radiology;  Laterality: N/A;  DR. Fredia Sorrow PERFORMING   Family History  Problem Relation Age of Onset  . Aneurysm Sister   . Hypertension Other   . Hypertension Mother   . Stroke Mother   . Migraines Mother   . Seizures Father   . Asthma Brother    History   Social History  . Marital Status: Married    Spouse Name: N/A    Number of Children: 5  . Years of Education: N/A   Occupational History  . Disability, former speech pathologist    Social History Main Topics  . Smoking status: Former Smoker -- 1.00 packs/day for 20 years    Types: Cigarettes, E-cigarettes    Quit date: 01/04/2014  . Smokeless tobacco: Never Used  . Alcohol Use: 4.2 oz/week    7 Glasses of wine per week  . Drug Use: No  . Sexual Activity: Yes   Other Topics Concern  . Not on file   Social History Narrative   ROS-see HPI Constitutional:   No-   weight loss, night sweats, fevers, chills, fatigue, lassitude. HEENT:   + headaches, difficulty swallowing, tooth/dental problems, +sore throat,       +sneezing,+ itching, ear ache, +nasal congestion, post nasal drip,  CV:  No-   chest pain, orthopnea, PND, swelling in lower extremities, anasarca,                                  dizziness, palpitations Resp: + shortness of breath with exertion or at rest.              +productive cough,  No non-productive cough,  No- coughing up of blood.              No-   change in color of mucus.  No- wheezing.   Skin: + Rash GI:  No-   heartburn, indigestion, abdominal pain, nausea, vomiting, diarrhea,                 change in bowel habits, loss of appetite GU: No-   dysuria, change in color of urine, no urgency or frequency.  No- flank pain. MS:  + joint pain or swelling.  No- decreased range of motion.  No- back pain. Neuro-     nothing unusual Psych:  No- change in mood or affect.  depression or +anxiety.  No memory loss.  OBJ- Physical Exam General- Alert, Oriented, Affect-appropriate, Distress- none acute Skin- rash-none, lesions- none, excoriation- none Lymphadenopathy- none Head- atraumatic            Eyes- Gross vision  intact, PERRLA, conjunctivae and secretions clear            Ears- Hearing, canals-normal            Nose- Clear, +Septal dev, mucus, polyps, erosion, perforation             Throat- Mallampati II , mucosa clear , drainage- none, tonsils- atrophic Neck- flexible , trachea midline, no stridor , thyroid nl, carotid no bruit Chest - symmetrical excursion , unlabored           Heart/CV- RRR , no murmur , no gallop  , no rub, nl s1 s2                           - JVD- none , edema- none, stasis changes- none, varices- none           Lung- clear to P&A, wheeze- none, cough- none , dullness-none, rub- none           Chest wall-  Abd- tender-no, distended-no, bowel sounds-present, HSM- no Br/ Gen/ Rectal- Not done, not indicated Extrem- cyanosis- none, clubbing, none, atrophy- none, strength- nl Neuro- grossly intact to observation

## 2014-05-27 NOTE — Patient Instructions (Addendum)
Order- Schedule PFT   Dx chronic bronchitis  Order- lab- Allergy profile, Food IgE profile     Dx allergc rhinitis  Please call as needed

## 2014-05-28 LAB — ALLERGEN FOOD PROFILE SPECIFIC IGE
Chicken IgE: <0.1 kU/L
Egg White IgE: <0.1 kU/L
Fish Cod: <0.1 kU/L
IgE (Immunoglobulin E), Serum: 9 kU/L (ref <115)
Milk IgE: <0.1 kU/L
Peanut IgE: <0.1 kU/L
Shrimp IgE: <0.1 kU/L
Wheat IgE: <0.1 kU/L

## 2014-05-28 LAB — ALLERGY FULL PROFILE
Allergen, D pternoyssinus,d7: <0.1 kU/L
Allergen,Goose feathers, e70: <0.1 kU/L
Alternaria Alternata: <0.1 kU/L
Aspergillus fumigatus, m3: <0.1 kU/L
Bahia Grass: <0.1 kU/L
Bermuda Grass: <0.1 kU/L
Box Elder IgE: <0.1 kU/L
Candida Albicans: <0.1 kU/L
Cat Dander: <0.1 kU/L
D. farinae: <0.1 kU/L
Dog Dander: <0.1 kU/L
G005 Rye, Perennial: <0.1 kU/L
Goldenrod: <0.1 kU/L
Helminthosporium halodes: <0.1 kU/L
House Dust Hollister: <0.1 kU/L
Oak: <0.1 kU/L
Stemphylium Botryosum: <0.1 kU/L
Sycamore Tree: <0.1 kU/L
Timothy Grass: <0.1 kU/L

## 2014-06-17 ENCOUNTER — Ambulatory Visit (INDEPENDENT_AMBULATORY_CARE_PROVIDER_SITE_OTHER): Payer: BLUE CROSS/BLUE SHIELD | Admitting: Internal Medicine

## 2014-06-17 DIAGNOSIS — J410 Simple chronic bronchitis: Secondary | ICD-10-CM | POA: Diagnosis not present

## 2014-06-17 LAB — PULMONARY FUNCTION TEST
DL/VA % pred: 87 %
DL/VA: 4.12 ml/min/mmHg/L
DLCO UNC: 20.64 ml/min/mmHg
DLCO unc % pred: 90 %
FEF 25-75 Post: 2.38 L/sec
FEF 25-75 Pre: 2.57 L/sec
FEF2575-%Change-Post: -7 %
FEF2575-%PRED-POST: 96 %
FEF2575-%Pred-Pre: 104 %
FEV1-%Change-Post: 0 %
FEV1-%PRED-PRE: 105 %
FEV1-%Pred-Post: 105 %
FEV1-Post: 2.72 L
FEV1-Pre: 2.71 L
FEV1FVC-%Change-Post: 3 %
FEV1FVC-%Pred-Pre: 100 %
FEV6-%CHANGE-POST: -3 %
FEV6-%PRED-POST: 103 %
FEV6-%PRED-PRE: 107 %
FEV6-PRE: 3.41 L
FEV6-Post: 3.3 L
FEV6FVC-%PRED-POST: 103 %
FEV6FVC-%PRED-PRE: 103 %
FVC-%CHANGE-POST: -3 %
FVC-%PRED-POST: 100 %
FVC-%Pred-Pre: 103 %
FVC-PRE: 3.41 L
FVC-Post: 3.3 L
Post FEV1/FVC ratio: 82 %
Post FEV6/FVC ratio: 100 %
Pre FEV1/FVC ratio: 79 %
Pre FEV6/FVC Ratio: 100 %
RV % PRED: 68 %
RV: 1.28 L
TLC % PRED: 97 %
TLC: 4.77 L

## 2014-06-17 NOTE — Progress Notes (Signed)
PFT done today. 

## 2014-07-15 ENCOUNTER — Ambulatory Visit (INDEPENDENT_AMBULATORY_CARE_PROVIDER_SITE_OTHER): Payer: BLUE CROSS/BLUE SHIELD | Admitting: Internal Medicine

## 2014-07-15 ENCOUNTER — Encounter: Payer: Self-pay | Admitting: Internal Medicine

## 2014-07-15 VITALS — BP 110/72 | HR 61 | Ht 63.0 in | Wt 139.2 lb

## 2014-07-15 DIAGNOSIS — J410 Simple chronic bronchitis: Secondary | ICD-10-CM

## 2014-07-15 NOTE — Progress Notes (Signed)
[birth year]- 53 yoF former smoker referred courtesy of Dr. Yamagata/radiology for pulmonary management after coiling of a pulmonary venous malformation. 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 She had had a previous CVA in 2006 no known DVT or clot source. In December 2015 had coiling of her venous malformation. Over the next several weeks she became more aware of shortness of breath with exertion and increased cough. There was transient hemoptysis after the coiling procedure, now resolved. Still occasional pleuritic pain in right mid chest beneath the breast. Lying supine, she feels a pressure on her chest. History of bronchitis since childhood. Parents it smoked. Brother had asthma she denies history of pneumonia.  Pt c/o sob, chest tightness and throat tickle cough at night.  Being followed for question of lupus. Much sinus congestion with no surgery. Says she is allergic to shrimp, cats. Never evaluated. GERD controlled with diet. Environment: House-gas heat, no indoor smokers, dog indoors, no mold. CTa chest    05/21/14- IMPRESSION: 1. No evidence of acute pulmonary embolus. 2. Right middle lobe arteriovenous malformation coiling. Electronically Signed  By: Elige Ko  On: 05/21/2014 16:44  07/15/14- 54 yoF former smoker seen for f/u w hx coiling of pulmonary AVM c hemoptysis, hx embolic CVA, chronic rhinitis FOLLOWS FOR: Review labs and PFT with patient.   ROS-see HPI Constitutional:   No-   weight loss, night sweats, fevers, chills, fatigue, lassitude. HEENT:   + headaches, difficulty swallowing, tooth/dental problems, +sore throat,       +sneezing,+ itching, ear ache, +nasal congestion, post nasal drip,  CV:  No-   chest pain, orthopnea, PND, swelling in lower extremities, anasarca,                                  dizziness, palpitations Resp: + shortness of breath with exertion or at rest.    +productive cough,  No non-productive cough,  No- coughing up of blood.              No-   change in color of mucus.  No- wheezing.   Skin: + Rash GI:  No-   heartburn, indigestion, abdominal pain, nausea, vomiting, diarrhea,                 change in bowel habits, loss of appetite GU: No-   dysuria, change in color of urine, no urgency or frequency.  No- flank pain. MS:  + joint pain or swelling.  No- decreased range of motion.  No- back pain. Neuro-     nothing unusual Psych:  No- change in mood or affect. depression or +anxiety.  No memory loss.  OBJ- Physical Exam General- Alert, Oriented, Affect-appropriate, Distress- none acute Skin- rash-none, lesions- none, excoriation- none Lymphadenopathy- none Head- atraumatic            Eyes- Gross vision intact, PERRLA, conjunctivae and secretions clear            Ears- Hearing, canals-normal            Nose- Clear, +Septal dev, mucus, polyps, erosion, perforation             Throat- Mallampati II , mucosa clear , drainage- none, tonsils- atrophic Neck- flexible , trachea midline, no stridor , thyroid nl, carotid no bruit Chest - symmetrical excursion , unlabored  Heart/CV- RRR , no murmur , no gallop  , no rub, nl s1 s2                           - JVD- none , edema- none, stasis changes- none, varices- none           Lung- clear to P&A, wheeze- none, cough- none , dullness-none, rub- none           Chest wall-  Abd- tender-no, distended-no, bowel sounds-present, HSM- no Br/ Gen/ Rectal- Not done, not indicated Extrem- cyanosis- none, clubbing, none, atrophy- none, strength- nl Neuro- grossly intact to observation

## 2014-07-15 NOTE — Patient Instructions (Signed)
Suggest you manage this as musculoskeletal chest wall pain due to arthritis in the joints where your ribs join your sternum.- heat, ibuprofen, etc as needed

## 2014-12-02 ENCOUNTER — Ambulatory Visit (INDEPENDENT_AMBULATORY_CARE_PROVIDER_SITE_OTHER): Payer: BLUE CROSS/BLUE SHIELD | Admitting: Internal Medicine

## 2014-12-02 ENCOUNTER — Encounter: Payer: Self-pay | Admitting: Internal Medicine

## 2014-12-02 ENCOUNTER — Telehealth: Payer: Self-pay | Admitting: Internal Medicine

## 2014-12-02 VITALS — BP 114/76 | HR 68 | Ht 63.0 in | Wt 139.2 lb

## 2014-12-02 DIAGNOSIS — R042 Hemoptysis: Secondary | ICD-10-CM | POA: Diagnosis not present

## 2014-12-02 DIAGNOSIS — Q2572 Congenital pulmonary arteriovenous malformation: Secondary | ICD-10-CM

## 2014-12-02 DIAGNOSIS — R0789 Other chest pain: Secondary | ICD-10-CM

## 2014-12-02 DIAGNOSIS — I280 Arteriovenous fistula of pulmonary vessels: Secondary | ICD-10-CM | POA: Diagnosis not present

## 2014-12-02 DIAGNOSIS — M949 Disorder of cartilage, unspecified: Secondary | ICD-10-CM

## 2014-12-02 NOTE — Progress Notes (Signed)
2/ 8/16- 53 yoF former smoker referred courtesy of Dr. Yamagata/radiology for pulmonary management after coiling of a pulmonary venous malformation. 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 She had had a previous CVA in 2006 no known DVT or clot source. In December 2015 had coiling of her venous malformation. Over the next several weeks she became more aware of shortness of breath with exertion and increased cough. There was transient hemoptysis after the coiling procedure, now resolved. Still occasional pleuritic pain in right mid chest beneath the breast. Lying supine, she feels a pressure on her chest. History of bronchitis since childhood. Parents it smoked. Brother had asthma she denies history of pneumonia.  Pt c/o sob, chest tightness and throat tickle cough at night.  Being followed for question of lupus. Much sinus congestion with no surgery. Says she is allergic to shrimp, cats. Never evaluated. GERD controlled with diet. Environment: House-gas heat, no indoor smokers, dog indoors, no mold. CTa chest    05/21/14- IMPRESSION: 1. No evidence of acute pulmonary embolus. 2. Right middle lobe arteriovenous malformation coiling. Electronically Signed  By: Elige Ko  On: 05/21/2014 16:44  07/15/14- 54 yoF former smoker seen for f/u w hx coiling of pulmonary AVM c hemoptysis, hx embolic CVA, chronic rhinitis FOLLOWS FOR: Review labs and PFT with patient.   12/02/14- 72 yoF former smoker seen for f/u w hx coiling of pulmonary AVM c hemoptysis, hx embolic CVA, chronic rhinitis ACUTE VISIT: dizzy, breaks out in sweat, center breast bone pain and moves to right side; SOB as well. Coughed up blood.  Husband here. She had called reporting hemoptysis of small clots intermittently over past 7 days without other bleeding. Twinge chest pains off and on. Xiphoid pain especially in the last 8 days. Sometimes clear phlegm with a  trace of pink. Denies palpitations, fever, adenopathy.  ROS-see HPI Constitutional:   No-   weight loss, night sweats, fevers, chills, fatigue, lassitude. HEENT:   + headaches, difficulty swallowing, tooth/dental problems, +sore throat,       +sneezing,+ itching, ear ache, +nasal congestion, post nasal drip,  CV:  No-   chest pain, orthopnea, PND, swelling in lower extremities, anasarca,                                                            izziness, palpitations Resp: + shortness of breath with exertion or at rest.              +productive cough,  No non-productive cough,  +coughing up of blood.              No-   change in color of mucus.  No- wheezing.   Skin: + Rash GI:  No-   heartburn, indigestion, abdominal pain, nausea, vomiting,  GU MS:  + joint pain or swelling.  No- decreased range of motion.  No- back pain. Neuro-     nothing unusual Psych:  No- change in mood or affect. depression or +anxiety.  No memory loss.  OBJ- Physical Exam General- Alert, Oriented, Affect-appropriate, Distress- none acute Skin- rash-none, lesions- none, excoriation- none Lymphadenopathy- none Head- atraumatic            Eyes- Gross vision intact, PERRLA, conjunctivae and secretions  clear            Ears- Hearing, canals-normal            Nose- Clear, +Septal dev, mucus, polyps, erosion, perforation             Throat- Mallampati II , mucosa clear , drainage- none, tonsils- atrophic Neck- flexible , trachea midline, no stridor , thyroid nl, carotid no bruit Chest - symmetrical excursion , unlabored           Heart/CV- RRR , no murmur , no gallop  , no rub, nl s1 s2                           - JVD- none , edema- none, stasis changes- none, varices- none           Lung- clear to P&A, wheeze- none, cough- none , dullness-none, rub- none           Chest wall-  Abd- Br/ Gen/ Rectal- Not done, not indicated Extrem- cyanosis- none, clubbing, none, atrophy- none, strength- nl Neuro- grossly intact  to observation

## 2014-12-02 NOTE — Telephone Encounter (Signed)
Patient says that she coughed up blood clots.  She said that she has been having chest pain.  1 blood clot was black, but the one she coughed up yesterday was red.  She said she is not sure if she should use inhaler.  Dr. Maple Hudson told her not to use one.  Patient says that she used one to catch her breath and it helped a little.  Patient having a lot of SOB to the point she can barely breath at all. Patient needs appointment ASAP.  Stabbing pain in right lung when inhaling.  Discomfort with exhaling.   Can we add to Dr. Roxy Cedar schedule today?

## 2014-12-02 NOTE — Patient Instructions (Signed)
Order- schedule CT angio chest   Dx hemoptysis, xiphoid pain, history of coiling pulmonary AVM   Please call  as needed

## 2014-12-02 NOTE — Telephone Encounter (Signed)
Please see if we can work her in

## 2014-12-02 NOTE — Telephone Encounter (Signed)
Pt is aware that we had slot open this afternoon and will be here for 2:30pm acute visit with CY. Nothing more needed at this time.

## 2014-12-03 ENCOUNTER — Telehealth: Payer: Self-pay | Admitting: Internal Medicine

## 2014-12-03 ENCOUNTER — Ambulatory Visit (INDEPENDENT_AMBULATORY_CARE_PROVIDER_SITE_OTHER)
Admission: RE | Admit: 2014-12-03 | Discharge: 2014-12-03 | Disposition: A | Discharge status: Home / Self Care | Payer: BLUE CROSS/BLUE SHIELD | Source: Ambulatory Visit | Attending: Internal Medicine | Admitting: Internal Medicine

## 2014-12-03 DIAGNOSIS — R042 Hemoptysis: Secondary | ICD-10-CM

## 2014-12-03 DIAGNOSIS — R0789 Other chest pain: Secondary | ICD-10-CM

## 2014-12-03 DIAGNOSIS — M949 Disorder of cartilage, unspecified: Secondary | ICD-10-CM

## 2014-12-03 MED ORDER — IOHEXOL 350 MG/ML SOLN
80.0000 mL | Freq: Once | INTRAVENOUS | Status: AC | PRN
  Administered 2014-12-03: 80 mL via INTRAVENOUS

## 2014-12-03 NOTE — Telephone Encounter (Signed)
Thanks and will forward to CY to advise on results.

## 2014-12-04 NOTE — Assessment & Plan Note (Signed)
Not sure how this relates to recent small-volume hemoptysis or to the xiphoid pain she describes. Plan-CT angiogram

## 2014-12-04 NOTE — Assessment & Plan Note (Signed)
Small blood clots coughed up a few times in the last 7 days. Very occasionally she takes a baby aspirin. No obvious bacterial infection. Plan-quiet activity, avoid aspirin, CT angiogram chest. If substantial bleeding continues then we began talking about bronchoscopy.

## 2014-12-05 NOTE — Telephone Encounter (Signed)
CY please advise  

## 2014-12-09 NOTE — Telephone Encounter (Signed)
Patient has been notified of results. See imaging/labs Nothing further needed.

## 2015-03-05 ENCOUNTER — Ambulatory Visit (INDEPENDENT_AMBULATORY_CARE_PROVIDER_SITE_OTHER): Payer: BLUE CROSS/BLUE SHIELD | Admitting: Internal Medicine

## 2015-03-05 ENCOUNTER — Encounter: Payer: Self-pay | Admitting: Internal Medicine

## 2015-03-05 VITALS — BP 118/68 | HR 55 | Ht 63.0 in | Wt 139.6 lb

## 2015-03-05 DIAGNOSIS — Q2572 Congenital pulmonary arteriovenous malformation: Secondary | ICD-10-CM

## 2015-03-05 DIAGNOSIS — J410 Simple chronic bronchitis: Secondary | ICD-10-CM

## 2015-03-05 DIAGNOSIS — Z23 Encounter for immunization: Secondary | ICD-10-CM | POA: Diagnosis not present

## 2015-03-05 NOTE — Assessment & Plan Note (Signed)
I told her she could expect a little streak of blood occasionally with hard coughing but to let us know if bleeding was heavier. She is really doing quite well now

## 2015-03-05 NOTE — Progress Notes (Signed)
2/ 8/16- 53 yoF former smoker referred courtesy of Dr. Yamagata/radiology for pulmonary management after coiling of a pulmonary venous malformation. 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 She had had a previous CVA in 2006 no known DVT or clot source. In December 2015 had coiling of her venous malformation. Over the next several weeks she became more aware of shortness of breath with exertion and increased cough. There was transient hemoptysis after the coiling procedure, now resolved. Still occasional pleuritic pain in right mid chest beneath the breast. Lying supine, she feels a pressure on her chest. History of bronchitis since childhood. Parents it smoked. Brother had asthma she denies history of pneumonia.  Pt c/o sob, chest tightness and throat tickle cough at night.  Being followed for question of lupus. Much sinus congestion with no surgery. Says she is allergic to shrimp, cats. Never evaluated. GERD controlled with diet. Environment: House-gas heat, no indoor smokers, dog indoors, no mold. CTa chest    05/21/14- IMPRESSION: 1. No evidence of acute pulmonary embolus. 2. Right middle lobe arteriovenous malformation coiling. Electronically Signed  By: Elige Ko  On: 05/21/2014 16:44  07/15/14- 54 yoF former smoker seen for f/u w hx coiling of pulmonary AVM c hemoptysis, hx embolic CVA, chronic rhinitis FOLLOWS FOR: Review labs and PFT with patient.   12/02/14- 50 yoF former smoker seen for f/u w hx coiling of pulmonary AVM c hemoptysis, hx embolic CVA, chronic rhinitis ACUTE VISIT: dizzy, breaks out in sweat, center breast bone pain and moves to right side; SOB as well. Coughed up blood.  Husband here. She had called reporting hemoptysis of small clots intermittently over past 7 days without other bleeding. Twinge chest pains off and on. Xiphoid pain especially in the last 8 days. Sometimes clear phlegm with a  trace of pink. Denies palpitations, fever, adenopathy.  03/05/15- 44 yoF former smoker seen for f/u w hx coiling of pulmonary AVM, hemoptysis, hx embolic CVA, chronic rhinitis FOLLOWS FOR:Pt states she is doing well overall; slight cough with green productive phelgm at times-recent sinus infection. Pt denies any hemoptysis. Husband here She feels well. Some dyspnea on exertion only if physically active. Minor chest wall twinge to right of the xiphoid process if she takes a deep breath or bends. No recent blood. CT chest 12/03/14 IMPRESSION: 1. Stable appearance of coil embolization of right middle lobe AVM. 2. No evidence of acute pulmonary embolism. Electronically Signed  By: Dwyane Dee M.D.  On: 12/03/2014 15:15  ROS-see HPI Constitutional:   No-   weight loss, night sweats, fevers, chills, fatigue, lassitude. HEENT:   + headaches, difficulty swallowing, tooth/dental problems, +sore throat,    sneezing,  itching, ear ache, +nasal congestion, post nasal drip,  CV:  No-   chest pain, orthopnea, PND, swelling in lower extremities, anasarca,                                                            izziness, palpitations Resp: + shortness of breath with exertion or at rest.              +productive cough,  No non-productive cough,  coughing up of blood.  No-   change in color of mucus.  No- wheezing.   Skin:  Rash GI:  No-   heartburn, indigestion, abdominal pain, nausea, vomiting,  GU MS:  + joint pain or swelling.  No- decreased range of motion.  No- back pain. Neuro-     nothing unusual Psych:  No- change in mood or affect. depression or +anxiety.  No memory loss.  OBJ- Physical Exam General- Alert, Oriented, Affect-appropriate, Distress- none acute Skin- rash-none, lesions- none, excoriation- none Lymphadenopathy- none Head- atraumatic            Eyes- Gross vision intact, PERRLA, conjunctivae and secretions clear            Ears- Hearing, canals-normal             Nose- Clear, +Septal dev, mucus, polyps, erosion, perforation             Throat- Mallampati II , mucosa clear , drainage- none, tonsils- atrophic Neck- flexible , trachea midline, no stridor , thyroid nl, carotid no bruit Chest - symmetrical excursion , unlabored           Heart/CV- RRR , no murmur , no gallop  , no rub, nl s1 s2                           - JVD- none , edema- none, stasis changes- none, varices- none           Lung- clear to P&A, wheeze- none, cough- none , dullness-none, rub- none           Chest wall-  Abd- Br/ Gen/ Rectal- Not done, not indicated Extrem- cyanosis- none, clubbing, none, atrophy- none, strength- nl Neuro- grossly intact to observation

## 2015-03-05 NOTE — Assessment & Plan Note (Signed)
Currently well controlled without recent acute infection. Plan-pneumonia vaccine

## 2015-03-05 NOTE — Patient Instructions (Signed)
Pneumovax 23  Please call if we can help

## 2015-03-07 ENCOUNTER — Telehealth: Payer: Self-pay | Admitting: Internal Medicine
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
## 2015-03-07 NOTE — Telephone Encounter (Signed)
Patient is concerned about the air quality outside.  She went outside long enough to get the mail and she started coughing and couldn't catch her breath.  She took 2 puffs of inhaler and she is still having a hard time.  Hoarseness, SOB.    [facility] [place]  Allergies  Allergen Reactions  . Penicillins Shortness Of Breath, Itching and Rash  . Shellfish Allergy Shortness Of Breath and Itching  . Lipitor [Atorvastatin]     SOB, leg cramps  . Olive Oil Diarrhea and Nausea And Vomiting   Current Outpatient Prescriptions on File Prior to Visit  Medication Sig Dispense Refill  . aspirin 81 MG tablet Take 81 mg by mouth every morning.    . bisoprolol-hydrochlorothiazide (ZIAC) 5-6.25 MG per tablet Take 1 tablet by mouth daily.    Marland Kitchen desonide (DESOWEN) 0.05 % ointment Apply 1 application topically 3 (three) times daily.  1  . EPINEPHrine (EPIPEN 2-PAK) 0.3 mg/0.3 mL DEVI Use as directed     . escitalopram (LEXAPRO) 10 MG tablet 10mg  in the morning and 30mg  in the evening    . fluorometholone (FML FORTE) 0.25 % ophthalmic suspension Place 1 drop into both eyes 2 (two) times daily.     . memantine (NAMENDA) 10 MG tablet Take 10 mg by mouth daily.      . rosuvastatin (CRESTOR) 10 MG tablet Take 10 mg by mouth at bedtime.     . VENTOLIN HFA 108 (90 BASE) MCG/ACT inhaler Inhale 2 puffs into the lungs every 4 (four) hours as needed.  5  . zonisamide (ZONEGRAN) 100 MG capsule Take 300 mg by mouth 2 (two) times daily.      No current facility-administered medications on file prior to visit.

## 2015-03-07 NOTE — Telephone Encounter (Signed)
Called made pt aware of below. Nothing further needed 

## 2015-03-12 ENCOUNTER — Telehealth: Payer: Self-pay | Admitting: Internal Medicine

## 2015-03-12 NOTE — Telephone Encounter (Signed)
Called and spoke to pt. Pt stated she was informed by Dr. Maple Hudson that she will need abx prior to dental procedure. Pt states she is having a cleaning and crown placed on 04/09/15. Pt denies any issues/complaints at this time.   Dr. Maple Hudson please advise. Thanks.   Allergies  Allergen Reactions  . Penicillins Shortness Of Breath, Itching and Rash  . Shellfish Allergy Shortness Of Breath and Itching  . Lipitor [Atorvastatin]     SOB, leg cramps  . Olive Oil Diarrhea and Nausea And Vomiting    Current Outpatient Prescriptions on File Prior to Visit  Medication Sig Dispense Refill  . aspirin 81 MG tablet Take 81 mg by mouth every morning.    . bisoprolol-hydrochlorothiazide (ZIAC) 5-6.25 MG per tablet Take 1 tablet by mouth daily.    Marland Kitchen desonide (DESOWEN) 0.05 % ointment Apply 1 application topically 3 (three) times daily.  1  . EPINEPHrine (EPIPEN 2-PAK) 0.3 mg/0.3 mL DEVI Use as directed     . escitalopram (LEXAPRO) 10 MG tablet 10mg  in the morning and 30mg  in the evening    . fluorometholone (FML FORTE) 0.25 % ophthalmic suspension Place 1 drop into both eyes 2 (two) times daily.     . memantine (NAMENDA) 10 MG tablet Take 10 mg by mouth daily.      . rosuvastatin (CRESTOR) 10 MG tablet Take 10 mg by mouth at bedtime.     . VENTOLIN HFA 108 (90 BASE) MCG/ACT inhaler Inhale 2 puffs into the lungs every 4 (four) hours as needed.  5  . zonisamide (ZONEGRAN) 100 MG capsule Take 300 mg by mouth 2 (two) times daily.      No current facility-administered medications on file prior to visit.

## 2015-03-14 MED ORDER — AZITHROMYCIN 250 MG PO TABS: ORAL_TABLET | ORAL | Status: DC

## 2015-03-14 NOTE — Telephone Encounter (Signed)
Suggest azithromycin 250 mg, # 2 (= 500 mg at one dose) taken an hour or so before dental procedures

## 2015-03-14 NOTE — Telephone Encounter (Signed)
CY please advise  

## 2015-03-14 NOTE — Telephone Encounter (Signed)
Patient notified of Dr. Roxy Cedar recommendations. Rx sent to pharmacy. Per Dr. Maple Hudson, sent 5 refills because patient is having 4 more procedures done Nothing further needed. Closing encounter

## 2015-05-12 ENCOUNTER — Telehealth: Payer: Self-pay | Admitting: Internal Medicine

## 2015-05-12 NOTE — Telephone Encounter (Signed)
Per CY-pt can be seen by TP tomorrow at 2:45pm slot(AC okayed slot). Thanks.

## 2015-05-12 NOTE — Telephone Encounter (Signed)
Spoke with pt's husband. He is aware of CY's recommendation. She has been scheduled with TP tomorrow at 2:45pm.

## 2015-05-12 NOTE — Telephone Encounter (Signed)
Spoke with pt's husband. States that the pt has been having issues with her breathing since December 2015. Reports SOB, weakness, chest tightness, headache. SOB is worse at night time. Denies cough, wheezing or fever. They would like to know what CY recommends they do. CY - please advise. Thanks.  Allergies  Allergen Reactions  . Penicillins Shortness Of Breath, Itching and Rash  . Shellfish Allergy Shortness Of Breath and Itching  . Lipitor [Atorvastatin]     SOB, leg cramps  . Olive Oil Diarrhea and Nausea And Vomiting   Current Outpatient Prescriptions on File Prior to Visit  Medication Sig Dispense Refill  . aspirin 81 MG tablet Take 81 mg by mouth every morning.    Marland Kitchen azithromycin (ZITHROMAX) 250 MG tablet Take both tablets (500mg ) 1 hour before dental procedures 2 tablet 5  . bisoprolol-hydrochlorothiazide (ZIAC) 5-6.25 MG per tablet Take 1 tablet by mouth daily.    04-22-2001 desonide (DESOWEN) 0.05 % ointment Apply 1 application topically 3 (three) times daily.  1  . EPINEPHrine (EPIPEN 2-PAK) 0.3 mg/0.3 mL DEVI Use as directed     . escitalopram (LEXAPRO) 10 MG tablet 10mg  in the morning and 30mg  in the evening    . fluorometholone (FML FORTE) 0.25 % ophthalmic suspension Place 1 drop into both eyes 2 (two) times daily.     . memantine (NAMENDA) 10 MG tablet Take 10 mg by mouth daily.      . rosuvastatin (CRESTOR) 10 MG tablet Take 10 mg by mouth at bedtime.     . VENTOLIN HFA 108 (90 BASE) MCG/ACT inhaler Inhale 2 puffs into the lungs every 4 (four) hours as needed.  5  . zonisamide (ZONEGRAN) 100 MG capsule Take 300 mg by mouth 2 (two) times daily.      No current facility-administered medications on file prior to visit.

## 2015-05-13 ENCOUNTER — Ambulatory Visit (INDEPENDENT_AMBULATORY_CARE_PROVIDER_SITE_OTHER)
Admission: RE | Admit: 2015-05-13 | Discharge: 2015-05-13 | Disposition: A | Discharge status: Home / Self Care | Payer: Managed Care, Other (non HMO) | Source: Ambulatory Visit | Attending: Adult Health | Admitting: Adult Health

## 2015-05-13 ENCOUNTER — Ambulatory Visit (INDEPENDENT_AMBULATORY_CARE_PROVIDER_SITE_OTHER): Payer: Managed Care, Other (non HMO) | Admitting: Adult Health

## 2015-05-13 ENCOUNTER — Encounter: Payer: Self-pay | Admitting: Adult Health

## 2015-05-13 VITALS — BP 134/80 | HR 60 | Temp 98.8°F | Ht 63.0 in | Wt 139.0 lb

## 2015-05-13 DIAGNOSIS — J3089 Other allergic rhinitis: Secondary | ICD-10-CM

## 2015-05-13 DIAGNOSIS — R0602 Shortness of breath: Secondary | ICD-10-CM

## 2015-05-13 DIAGNOSIS — J42 Unspecified chronic bronchitis: Secondary | ICD-10-CM

## 2015-05-13 DIAGNOSIS — J309 Allergic rhinitis, unspecified: Secondary | ICD-10-CM | POA: Diagnosis not present

## 2015-05-13 DIAGNOSIS — J302 Other seasonal allergic rhinitis: Secondary | ICD-10-CM

## 2015-05-13 MED ORDER — AZITHROMYCIN 250 MG PO TABS: ORAL_TABLET | ORAL | Status: AC

## 2015-05-13 MED ORDER — LEVALBUTEROL HCL 0.63 MG/3ML IN NEBU
0.6300 mg | INHALATION_SOLUTION | Freq: Once | RESPIRATORY_TRACT | Status: AC
  Administered 2015-05-13: 0.63 mg via RESPIRATORY_TRACT

## 2015-05-13 NOTE — Progress Notes (Signed)
Subjective:    Patient ID: Priscilla Duke, female    DOB: 1960-06-10, 55 y.o.   MRN: [id number]  HPI 55 yo female former smoker with previous hx of pulmonary AVM s/p coiling, embolic CVA and chronic rhinitis /bronchitis   05/13/2015 Acute OV  Pt presents for an acute office visit. Complains of increased SOB with activity, chest tightness/congestion, fatigue, sinus pressure/drainage, occasional dry cough and nausea x 2-3  days. Denies any wheezing, fever or vomiting. No chest pain, hemoptyiss , orthopnea, or edema.    Past Medical History  Diagnosis Date  . 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 Didelphic uterus     Dr. Madelaine Etienne  . Colonic polyp   . Hypertension   . Depression   . Hypercholesterolemia   . Allergy     skin, scalp  . IBS (irritable bowel syndrome)   . Anxiety   . GERD (gastroesophageal reflux disease)   . Inguinal hernia     right   . PONV (postoperative nausea and vomiting)     "just after hemorrhoidectomy"  . Heart murmur     as a child and "when I get sick"  . Shortness of breath dyspnea   . Chronic bronchitis (HCC)     "get it q winter"  . Headache     "at least weekly" (04/04/2014)  . Migraine     "a few times/yr" (04/04/2014)  . Seizures (HCC)     "none for years now" (04/04/2014)  . Arthritis     "down my entire spine; hands, knees" (04/04/2014)  . Mild cognitive impairment since 2006 stroke  . Stroke [facility]) 2006; 01/04/2014    "mild word finding problems since; mild weakness right side" (04/04/2014)   Current Outpatient Prescriptions on File Prior to Visit  Medication Sig Dispense Refill  . aspirin 81 MG tablet Take 81 mg by mouth every morning.    . bisoprolol-hydrochlorothiazide (ZIAC) 5-6.25 MG per tablet Take 1 tablet by mouth daily.    Marland Kitchen desonide (DESOWEN) 0.05 % ointment Apply 1 application topically 3 (three) times daily.  1  . EPINEPHrine (EPIPEN 2-PAK) 0.3 mg/0.3 mL DEVI Use as directed     . escitalopram (LEXAPRO)  10 MG tablet 10mg  in the morning and 30mg  in the evening    . memantine (NAMENDA) 10 MG tablet Take 10 mg by mouth daily.      . rosuvastatin (CRESTOR) 10 MG tablet Take 10 mg by mouth at bedtime.     . VENTOLIN HFA 108 (90 BASE) MCG/ACT inhaler Inhale 2 puffs into the lungs every 4 (four) hours as needed.  5  . zonisamide (ZONEGRAN) 100 MG capsule Take 300 mg by mouth 2 (two) times daily.     azithromycin (ZITHROMAX) 250 MG tablet Take both tablets (500mg ) 1 hour before dental procedures (Patient not taking: Reported on 05/13/2015) 2 tablet 5   No current facility-administered medications on file prior to visit.    Review of Systems .Constitutional:   No  weight loss, night sweats,  Fevers, chills, + fatigue, or  lassitude.  HEENT:   No headaches,  Difficulty swallowing,  Tooth/dental problems, or  Sore throat,                No sneezing, itching, ear ache, nasal congestion, post nasal drip,   CV:  No chest pain,  Orthopnea, PND, swelling in lower extremities, anasarca, dizziness, palpitations, syncope.   GI  No heartburn, indigestion, abdominal  pain, nausea, vomiting, diarrhea, change in bowel habits, loss of appetite, bloody stools.   Resp:  .  No chest wall deformity  Skin: no rash or lesions.  GU: no dysuria, change in color of urine, no urgency or frequency.  No flank pain, no hematuria   MS:  No joint pain or swelling.  No decreased range of motion.  No back pain.  Psych:  No change in mood or affect. No depression or anxiety.  No memory loss.         Objective:   Physical Exam Filed Vitals:   05/13/15 1501  BP: 134/80  Pulse: 60  Temp: 98.8 F (37.1 C)  TempSrc: Oral  Height: 5\' 3"  (1.6 m)  Weight: 139 lb (63.05 kg)  SpO2: 99%   GEN: A/Ox3; pleasant , NAD  HEENT:  [place]/AT,  EACs-clear, TMs-wnl, NOSE-clear, THROAT-clear, no lesions, no postnasal drip or exudate noted.   NECK:  Supple w/ fair ROM; no JVD; normal carotid impulses w/o bruits; no thyromegaly or  nodules palpated; no lymphadenopathy.  RESP  Decreased BS in bases no accessory muscle use, no dullness to percussion  CARD:  RRR, no m/r/g  , no peripheral edema, pulses intact, no cyanosis or clubbing.  GI:   Soft & nt; nml bowel sounds; no organomegaly or masses detected.  Musco: Warm bil, no deformities or joint swelling noted.   Neuro: alert, no focal deficits noted.    Skin: Warm, no lesions or rashes        Assessment & Plan:

## 2015-05-13 NOTE — Patient Instructions (Signed)
Zpack take as directed.  Zyrtec 10mg  At bedtime  As needed  Drainage .  Saline nasal rinses As needed   Please contact office for sooner follow up if symptoms do not improve or worsen or seek emergency care  Follow up with Dr.  As planned and As needed

## 2015-05-18 NOTE — Assessment & Plan Note (Signed)
Flare   Plan  Zpack take as directed.  Zyrtec 10mg  At bedtime  As needed  Drainage .  Saline nasal rinses As needed   Please contact office for sooner follow up if symptoms do not improve or worsen or seek emergency care  Follow up with Dr.  As planned and As needed

## 2015-05-18 NOTE — Assessment & Plan Note (Signed)
Try Zyrtec and saline nasal rinses.

## 2015-09-03 ENCOUNTER — Ambulatory Visit: Payer: BLUE CROSS/BLUE SHIELD | Admitting: Internal Medicine

## 2015-10-25 ENCOUNTER — Other Ambulatory Visit: Payer: Self-pay | Admitting: Internal Medicine

## 2015-11-27 IMAGING — CT CT ANGIO CHEST
1 of 2 series · 1 of 4 positions shown · IV contrast (Iohexol (Omnipaque 350))
Comparison: Chest CT March 14, 2013 and chest radiograph
obtained earlier in the day

ADDENDUM:
The small thrombus in the peripheral right middle lobe medial
segment pulmonary artery branch may well be due to the recent
embolization; this small thrombus is near one of the embolization
coils. Note that other pulmonary arterial vessels elsewhere appear
patent and normal. The asymmetry in ventricular diameter is likely
due to left ventricular hypertrophy as opposed to potential right
heart strain.
CLINICAL DATA: Difficulty breathing. Hemoptysis. Recent lung
embolization for pulmonary arteriovenous malformation

EXAM:
CT ANGIOGRAPHY CHEST WITH CONTRAST
TECHNIQUE: Multidetector CT imaging of the chest was performed using the
standard protocol during bolus administration of intravenous
contrast. Multiplanar CT image reconstructions and MIPs were
obtained to evaluate the vascular anatomy.
CONTRAST:  80mL OMNIPAQUE IOHEXOL 350 MG/ML SOLN

[Series 200: locator · axial · 0.49mm/px · 1 of 1 slices shown]
[im 1/1  lung]
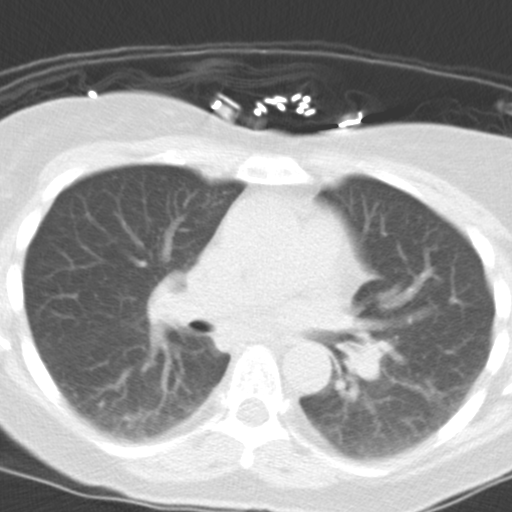

[1 of 4 positions shown; findings below may reference images not displayed]

FINDINGS: There is a small pulmonary embolus in a medial segment right middle
lobe branch, best seen on axial slices 172 through 175, series 407.
No other pulmonary emboli. There is no thoracic aortic aneurysm or
dissection. The right ventricle to left ventricle diameter ratio is
greater than 1.0 which raises concern for right heart strain.

The patient has undergone coil embolization for an arteriovenous
malformation in the medial segment of the right middle lobe. The
arteriovenous malformation appears occluded on this study. No
contrast extravasation is appreciable currently in this area. Note
that there is some susceptibility artifact from the coils which
could obscure a tiny contrast leak. There is some patchy
ground-glass type opacity in the medial segment of the right middle
lobe in the vicinity of the recent arteriovenous malformation. On
axial slice 68 series 406, there is a focal opacity adjacent to the
right heart border measuring 1.0 x 1.1 cm which probably represents
the thrombosed nidus of a portion of this vascular malformation.

There is patchy lower lobe atelectasis bilaterally.

There is no appreciable thoracic adenopathy. The pericardium is not
thickened. There is left ventricular hypertrophy.

Visualized upper abdominal structures are unremarkable. Thyroid
appears normal.

Review of the MIP images confirms the above findings.
IMPRESSION: Small peripheral pulmonary embolus in the medial segment right
middle lobe. No other pulmonary emboli. Findings concerning for
right heart strain. Echocardiography advised. Note that there is
left ventricular hypertrophy.

Patient has had recent coil embolization of an arteriovenous
malformation in the medial segment of the right middle lobe. A 1 cm
nodular opacity adjacent to the right heart border may represent a
portion of thrombosed vessel from this procedure. There is no active
appearing extravasation of intravenous contrast from this region,
although a tiny leak could be obscured by this susceptibility
artifact from the coils in this area focally. There is some patchy
ground-glass type opacity which probably represent inflammation from
the recent embolization procedure. An early pneumonia in this area
is also possible. Hemorrhage from the procedure in this area is a
third differential consideration. If hemoptysis persists, repeat CT
angiogram chest would be appropriate to assess for the possibility
of developing leak or recanalization of vascular malformation.

There is bibasilar atelectatic change.

Critical Value/emergent results were called by telephone at the time
of interpretation on 04/07/2014 at [DATE] to Juli Calixte, PA, who
verbally acknowledged these results.

## 2016-01-15 ENCOUNTER — Other Ambulatory Visit: Payer: Self-pay | Admitting: Internal Medicine

## 2016-01-15 ENCOUNTER — Telehealth: Payer: Self-pay | Admitting: Internal Medicine

## 2016-01-15 MED ORDER — AZITHROMYCIN 250 MG PO TABS: ORAL_TABLET | ORAL | 0 refills | Status: DC

## 2016-01-15 NOTE — Telephone Encounter (Signed)
LMOMTCB x 1 

## 2016-01-15 NOTE — Telephone Encounter (Signed)
Called spoke with spouse. He reports pt is scheduled for a dental procedure  tomorrow. Requesting refill azithromycin to be called in ASAP if possible, Pt last had refill on this 03/14/15 #2 tablets x 5 refills Please advise Dr. Maple Hudson thanks

## 2016-01-15 NOTE — Telephone Encounter (Signed)
Ok zpak   # 6, 2 today then one daily

## 2016-01-16 NOTE — Telephone Encounter (Signed)
Spoke with pt's husband. The antibiotic was sent in yesterday. They picked this up. Nothing further was needed.

## 2016-01-28 ENCOUNTER — Telehealth: Payer: Self-pay | Admitting: Internal Medicine

## 2016-01-28 NOTE — Telephone Encounter (Signed)
Called and spoke with pt and she is aware of CY recs.  appt has been scheduled for pt to see TP on Thursday at 330.

## 2016-01-28 NOTE — Telephone Encounter (Signed)
I would suspect possible pinched nerve and some bronchitis. Did ER do a chest xray?- I don't seen anything in Care everywhere, so outside our system. Please see if she could be seen by an NP

## 2016-01-28 NOTE — Telephone Encounter (Signed)
Spoke with Priscilla Duke, c/o worsening R sided chest pain radiating down arm, worsening fatigue, elevated BP, increased SOB Xseveral weeks.  Priscilla Duke states she also has a sensation of mucus collecting in throat, but has difficulty getting this up.  Priscilla Duke has been using her Ventolin which is not helping   Priscilla Duke's PCP did ekg which came back normal, went to ED with no definitive diagnosis after this visit.    Priscilla Duke requesting asap OV or further recs from CY.    Priscilla Duke uses [facility] [address] in [place].    CY please advise.  Thanks!   Allergies  Allergen Reactions  . Penicillins Shortness Of Breath, Itching and Rash  . Shellfish Allergy Shortness Of Breath and Itching  . Lipitor [Atorvastatin]     SOB, leg cramps  . Olive Oil Diarrhea and Nausea And Vomiting   Current Outpatient Prescriptions on File Prior to Visit  Medication Sig Dispense Refill  . aspirin 81 MG tablet Take 81 mg by mouth every morning.    Marland Kitchen azithromycin (ZITHROMAX) 250 MG tablet Take both tablets (500mg ) 1 hour before dental procedures 2 tablet 0  . bisoprolol-hydrochlorothiazide (ZIAC) 5-6.25 MG per tablet Take 1 tablet by mouth daily.    04-22-2001 desonide (DESOWEN) 0.05 % ointment Apply 1 application topically 3 (three) times daily.  1  . EPINEPHrine (EPIPEN 2-PAK) 0.3 mg/0.3 mL DEVI Use as directed     . escitalopram (LEXAPRO) 10 MG tablet 10mg  in the morning and 30mg  in the evening    . memantine (NAMENDA) 10 MG tablet Take 10 mg by mouth daily.      . rosuvastatin (CRESTOR) 10 MG tablet Take 10 mg by mouth at bedtime.     . VENTOLIN HFA 108 (90 BASE) MCG/ACT inhaler Inhale 2 puffs into the lungs every 4 (four) hours as needed.  5  . zonisamide (ZONEGRAN) 100 MG capsule Take 300 mg by mouth 2 (two) times daily.      No current facility-administered medications on file prior to visit.

## 2016-01-29 ENCOUNTER — Ambulatory Visit (INDEPENDENT_AMBULATORY_CARE_PROVIDER_SITE_OTHER)
Admission: RE | Admit: 2016-01-29 | Discharge: 2016-01-29 | Disposition: A | Discharge status: Home / Self Care | Payer: Managed Care, Other (non HMO) | Source: Ambulatory Visit | Attending: Adult Health | Admitting: Adult Health

## 2016-01-29 ENCOUNTER — Encounter: Payer: Self-pay | Admitting: Adult Health

## 2016-01-29 ENCOUNTER — Other Ambulatory Visit (INDEPENDENT_AMBULATORY_CARE_PROVIDER_SITE_OTHER): Payer: Managed Care, Other (non HMO)

## 2016-01-29 ENCOUNTER — Ambulatory Visit (INDEPENDENT_AMBULATORY_CARE_PROVIDER_SITE_OTHER): Payer: Managed Care, Other (non HMO) | Admitting: Adult Health

## 2016-01-29 VITALS — BP 128/86 | HR 76 | Temp 98.0°F | Ht 63.0 in | Wt 145.0 lb

## 2016-01-29 DIAGNOSIS — R55 Syncope and collapse: Secondary | ICD-10-CM

## 2016-01-29 DIAGNOSIS — R0602 Shortness of breath: Secondary | ICD-10-CM

## 2016-01-29 DIAGNOSIS — J42 Unspecified chronic bronchitis: Secondary | ICD-10-CM | POA: Diagnosis not present

## 2016-01-29 LAB — CBC WITH DIFFERENTIAL/PLATELET
BASOS ABS: 0 10*3/uL (ref 0.0–0.1)
BASOS PCT: 0.3 % (ref 0.0–3.0)
EOS PCT: 1.5 % (ref 0.0–5.0)
Eosinophils Absolute: 0.1 10*3/uL (ref 0.0–0.7)
HEMATOCRIT: 40.5 % (ref 36.0–46.0)
Hemoglobin: 13.7 g/dL (ref 12.0–15.0)
LYMPHS PCT: 22.1 % (ref 12.0–46.0)
Lymphs Abs: 1.7 10*3/uL (ref 0.7–4.0)
MCHC: 33.9 g/dL (ref 30.0–36.0)
MCV: 88.4 fl (ref 78.0–100.0)
MONOS PCT: 8.2 % (ref 3.0–12.0)
Monocytes Absolute: 0.6 10*3/uL (ref 0.1–1.0)
NEUTROS ABS: 5.2 10*3/uL (ref 1.4–7.7)
Neutrophils Relative %: 67.9 % (ref 43.0–77.0)
PLATELETS: 254 10*3/uL (ref 150.0–400.0)
RBC: 4.58 Mil/uL (ref 3.87–5.11)
RDW: 13.5 % (ref 11.5–15.5)
WBC: 7.6 10*3/uL (ref 4.0–10.5)

## 2016-01-29 LAB — BASIC METABOLIC PANEL
BUN: 14 mg/dL (ref 6–23)
CALCIUM: 9.8 mg/dL (ref 8.4–10.5)
CHLORIDE: 101 meq/L (ref 96–112)
CO2: 32 mEq/L (ref 19–32)
CREATININE: 0.92 mg/dL (ref 0.40–1.20)
GFR: 66.72 mL/min (ref >60.00)
Glucose, Bld: 142 mg/dL — ABNORMAL HIGH (ref 70–99)
Potassium: 3.7 mEq/L (ref 3.5–5.1)
Sodium: 138 mEq/L (ref 135–145)

## 2016-01-29 NOTE — Assessment & Plan Note (Addendum)
Syncopal episode , possible vasovagal with drop in b/p standing today .  Advised on fluid intake.  She has multiple risk factors. With CVA, OSA  Needs cardiac workup . Exam is unrevealing .  Needs neuro workup  Pt edcuation that if she develops chest pain, neuro sx or recurrence she is to go ER w/ EMS   Plan  Patient Instructions  Referral to Cardiology for syncope.  Needs to see your Neurologist in next few days for evaluation as well.  Chest xray and labs.  Continue on current regimen  Please contact office for sooner follow up if symptoms do not improve or worsen or seek emergency care

## 2016-01-29 NOTE — Patient Instructions (Addendum)
Referral to Cardiology for syncope.  Needs to see your Neurologist in next few days for evaluation as well.  Chest xray and labs.  Continue on current regimen  Follow up Dr. Maple Hudson  In 6 weeks and As needed   Please contact office for sooner follow up if symptoms do not improve or worsen or seek emergency care

## 2016-01-29 NOTE — Assessment & Plan Note (Signed)
Stable wihtout flare with previous AVM s/p coliling  Check cxr today  Check labs.

## 2016-01-29 NOTE — Progress Notes (Signed)
Subjective:    Patient ID: Priscilla Duke, female    DOB: 01-31-1961, 55 y.o.   MRN: [id number]  HPI 55  yo female former smoker with previous hx of pulmonary AVM s/p coiling, embolic CVA and chronic rhinitis /bronchitis   01/29/2016 Acute OV  Pt presents for an acute office visit. Says that yesterday she was putting up close bent down to pick up clothes and when she stood up got lightheaded and passed out.  Woke up as she was going down on her knees. She had no chest pain , visual/speech changes or arm weakness. She did feel dizzy when she stood back up . She is on b/p meds.  Says went to ER couple of months ago in [place] with chest pain, told it was not cardiac.  Says she was seen by cardiology with cardiac cath that she was told it was normal .  She is worried that her previous pulmonary AVM coliling has caused this. She denies flare of cough . No hemoptysis.  Says she was recenlty told she had OSA but she refused CPAP .       Past Medical History:  Diagnosis Date  . Allergy    skin, scalp  . Anxiety   . Arthritis    "down my entire spine; hands, knees" (04/04/2014)  . Chronic bronchitis (HCC)    "get it q winter"  . Colonic polyp   . Depression   . Didelphic uterus    Dr. Madelaine Etienne  . GERD (gastroesophageal reflux disease)   . Headache    "at least weekly" (04/04/2014)  . Heart murmur    as a child and "when I get sick"  . 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 Hypercholesterolemia   . Hypertension   . IBS (irritable bowel syndrome)   . Inguinal hernia    right   . Migraine    "a few times/yr" (04/04/2014)  . Mild cognitive impairment since 2006 stroke  . PONV (postoperative nausea and vomiting)    "just after hemorrhoidectomy"  . Seizures (HCC)    "none for years now" (04/04/2014)  . Shortness of breath dyspnea   . Stroke [facility]) 2006; 01/04/2014   "mild word finding problems since; mild weakness right side" (04/04/2014)   Current  Outpatient Prescriptions on File Prior to Visit  Medication Sig Dispense Refill  . aspirin 81 MG tablet Take 81 mg by mouth every morning.    . bisoprolol-hydrochlorothiazide (ZIAC) 5-6.25 MG per tablet Take 1 tablet by mouth daily.    Marland Kitchen desonide (DESOWEN) 0.05 % ointment Apply 1 application topically 3 (three) times daily.  1  . EPINEPHrine (EPIPEN 2-PAK) 0.3 mg/0.3 mL DEVI Use as directed     . escitalopram (LEXAPRO) 10 MG tablet 10mg  in the morning and 30mg  in the evening    . memantine (NAMENDA) 10 MG tablet Take 10 mg by mouth daily.      . rosuvastatin (CRESTOR) 10 MG tablet Take 10 mg by mouth at bedtime.     . VENTOLIN HFA 108 (90 BASE) MCG/ACT inhaler Inhale 2 puffs into the lungs every 4 (four) hours as needed.  5  . zonisamide (ZONEGRAN) 100 MG capsule Take 300 mg by mouth 2 (two) times daily.     azithromycin (ZITHROMAX) 250 MG tablet Take both tablets (500mg ) 1 hour before dental procedures (Patient not taking: Reported on 01/29/2016) 2 tablet 0   No current facility-administered medications on file prior  to visit.     Review of Systems .Constitutional:   No  weight loss, night sweats,  Fevers, chills, + fatigue, or  lassitude.  HEENT:   No headaches,  Difficulty swallowing,  Tooth/dental problems, or  Sore throat,                No sneezing, itching, ear ache, nasal congestion, post nasal drip,   CV:  No chest pain,  Orthopnea, PND, swelling in lower extremities, anasarca, dizziness, palpitations, syncope.   GI  No heartburn, indigestion, abdominal pain, nausea, vomiting, diarrhea, change in bowel habits, loss of appetite, bloody stools.   Resp:  .  No chest wall deformity  Skin: no rash or lesions.  GU: no dysuria, change in color of urine, no urgency or frequency.  No flank pain, no hematuria   MS:  No joint pain or swelling.  No decreased range of motion.  No back pain.  Psych:  No change in mood or affect. No depression or anxiety.  No memory loss.          Objective:   Physical Exam Vitals:   01/29/16 1544  BP: 128/86  Pulse: 76  Temp: 98 F (36.7 C)  TempSrc: Oral  SpO2: 99%  Weight: 145 lb (65.8 kg)  Height: 5\' 3"  (1.6 m)  standing b/p 110/80   GEN: A/Ox3; pleasant , NAD   HEENT:  [place]/AT,  EACs-clear, TMs-wnl, NOSE-clear, THROAT-clear, no lesions, no postnasal drip or exudate noted.   NECK:  Supple w/ fair ROM; no JVD; normal carotid impulses w/o bruits; no thyromegaly or nodules palpated; no lymphadenopathy.    RESP  Decreased BS in bases  no accessory muscle use, no dullness to percussion  CARD:  RRR, no m/r/g  , no peripheral edema, pulses intact, no cyanosis or clubbing.  GI:   Soft & nt; nml bowel sounds; no organomegaly or masses detected.   Musco: Warm bil, no deformities or joint swelling noted.   Neuro: alert, no focal deficits noted.  MAEW x 4 , no ext weakness  CN 2-12 intact. Norm gait. PERRL .   Skin: Warm, no lesions or rashes   Belina Mandile NP-C   Pulmonary and Critical Care  01/29/2016

## 2016-01-30 ENCOUNTER — Telehealth: Payer: Self-pay | Admitting: Cardiology

## 2016-01-30 NOTE — Telephone Encounter (Signed)
Closed encounter °

## 2016-02-01 NOTE — Progress Notes (Signed)
Cardiology Office Note    Date:  02/02/2016   ID:  Priscilla Duke, DOB 1960-05-15, MRN [id number]  PCP:  Priscilla Jan., MD  Cardiologist:  Priscilla Poteet [place], MD    History of Present Illness:  Priscilla Duke is a 55 y.o. female seen for evaluation of syncope. She has a history of HTN, HL, and prior CVA x 2. 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 She was doing well until last Tuesday when after taking a bath she went to her bedroom and opened a drawer in her dresser. She turned around and passed out. Initially hit on her knees then went down completely. No warning symptoms. Immediately afterwards she felt extremely dizzy and nauseous. This lasted only a few seconds. She was seen in pulmonary office and her bisoprolol HCT was discontinued. She was also seen by Neurology and started on Florinef. She has no recurrent dizziness since her episode last week. Denies any palpitations or SOB. She does describe more chronic episodes of chest pain. These are described as a very sharp mid sternal pain radiating to right chest and shoulder and mid back. Sometimes has a cold sweat with this. Prior to last week no prior syncope.    Past Medical History:  Diagnosis Date  . Allergy    skin, scalp  . Anxiety   . Arthritis    "down my entire spine; hands, knees" (04/04/2014)  . Chronic bronchitis (HCC)    "get it q winter"  . Colonic polyp   . Depression   . Didelphic uterus    Dr. Madelaine Etienne  . GERD (gastroesophageal reflux disease)   . Headache    "at least weekly" (04/04/2014)  . Heart murmur    as a child and "when I get sick"  . 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 Hypercholesterolemia   . Hypertension   . IBS (irritable bowel syndrome)   . Inguinal hernia    right   . Migraine    "a few times/yr" (04/04/2014)  . Mild cognitive impairment since 2006 stroke  . PONV (postoperative nausea and vomiting)    "just after hemorrhoidectomy"  . Seizures (HCC)     "none for years now" (04/04/2014)  . Shortness of breath dyspnea   . Stroke [facility]) 2006; 01/04/2014   "mild word finding problems since; mild weakness right side" (04/04/2014)    Past Surgical History:  Procedure Laterality Date  . ABDOMINOPLASTY  2007  . ANAL FISSURE REPAIR  1981   due to tear  . AUGMENTATION MAMMAPLASTY  2007  . BREAST IMPLANT REMOVAL  05/2009   w/lift  . COLONOSCOPY W/ BIOPSIES AND POLYPECTOMY    . EXCISIONAL HEMORRHOIDECTOMY  ~ 2009  . RADIOLOGY WITH ANESTHESIA  04/04/2014   transcatheter embolization of right middle lobe AVM  . RADIOLOGY WITH ANESTHESIA N/A 04/04/2014   Procedure: RADIOLOGY WITH ANESTHESIA;  Surgeon: Reola Calkins, MD;  Location: [facility] OR;  Service: Radiology;  Laterality: N/A;  DR. Fredia Sorrow PERFORMING  . TUBAL LIGATION  1998  . VAGINAL HYSTERECTOMY  2003    Current Medications: Outpatient Medications Prior to Visit  Medication Sig Dispense Refill  . aspirin 81 MG tablet Take 81 mg by mouth every morning.    Marland Kitchen azithromycin (ZITHROMAX) 250 MG tablet Take both tablets (500mg ) 1 hour before dental procedures 2 tablet 0  . desonide (DESOWEN) 0.05 % ointment Apply 1 application topically 3 (three) times daily.  1  .  EPINEPHrine (EPIPEN 2-PAK) 0.3 mg/0.3 mL DEVI Use as directed     . escitalopram (LEXAPRO) 10 MG tablet 10mg  in the morning and 30mg  in the evening    . memantine (NAMENDA) 10 MG tablet Take 10 mg by mouth daily.      . Omega-3 Fatty Acids (FISH OIL) 1000 MG CAPS Take 1 capsule by mouth daily.    . rosuvastatin (CRESTOR) 10 MG tablet Take 10 mg by mouth at bedtime.     . VENTOLIN HFA 108 (90 BASE) MCG/ACT inhaler Inhale 2 puffs into the lungs every 4 (four) hours as needed.  5  . zonisamide (ZONEGRAN) 100 MG capsule Take 300 mg by mouth 2 (two) times daily.     . bisoprolol-hydrochlorothiazide (ZIAC) 5-6.25 MG per tablet Take 1 tablet by mouth daily.     No facility-administered medications prior to visit.      Allergies:    Penicillins; Shellfish allergy; Lipitor [atorvastatin]; and Olive oil   Social History   Social History  . Marital status: Married    Spouse name: N/A  . Number of children: 5  . Years of education: N/A   Occupational History  . Disability, former speech pathologist Disabled   Social History Main Topics  . Smoking status: Light Tobacco Smoker    Packs/day: 1.00    Years: 20.00    Types: Cigarettes, E-cigarettes    Last attempt to quit: 01/04/2014  . Smokeless tobacco: Never Used     Comment: vapes daily  . Alcohol use 4.2 oz/week    7 Glasses of wine per week  . Drug use: No  . Sexual activity: Yes   Other Topics Concern  . None   Social History Narrative  . None     Family History:  The patient's family history includes Aneurysm in her sister; Asthma in her brother; Hypertension in her mother and other; Migraines in her mother; Seizures in her father; Stroke in her mother.   ROS:   Please see the history of present illness.    ROS All other systems reviewed and are negative.   PHYSICAL EXAM:   VS:  BP (!) 158/110 Comment: Left arm.  Pulse 89   Ht 5\' 3"  (1.6 m)   Wt 145 lb (65.8 kg)   BMI 25.69 kg/m    Orthostatic vitals Supine BP 178/110. Pulse 88 Sitting BP 166/108 pulse 86 Standing BP 152/100 pulse 92. GEN: Well nourished, well developed, in no acute distress  HEENT: normal  Neck: no JVD, carotid bruits, or masses Cardiac: RRR; no murmurs, rubs, or gallops,no edema  Respiratory:  clear to auscultation bilaterally, normal work of breathing GI: soft, nontender, nondistended, + BS MS: no deformity or atrophy  Skin: warm and dry, no rash Neuro:  Alert and Oriented x 3, Strength and sensation are intact Psych: euthymic mood, full affect  Wt Readings from Last 3 Encounters:  02/02/16 145 lb (65.8 kg)  01/29/16 145 lb (65.8 kg)  05/13/15 139 lb (63 kg)      Studies/Labs Reviewed:   EKG:  EKG is ordered today.  The ekg ordered today demonstrates NSR  with borderline prolonged QT at 484 msec. Otherwise normal. I have personally reviewed and interpreted this study.   Recent Labs: 01/29/2016: BUN 14; Creatinine, Ser 0.92; Hemoglobin 13.7; Platelets 254.0; Potassium 3.7; Sodium 138   Lipid Panel Dated 05/18/15: cholesterol 172, triglycerides 69, LDL 54, HDL 98  Additional studies/ records that were reviewed today include:  Ecg from June  2017 reported as normal. CXR June 2017 showed NAD   ASSESSMENT:    1. Syncope, unspecified syncope type   2. Essential hypertension      PLAN:  In order of problems listed above:  1. Syncope. Etiology unclear. Possible mechanisms include arrhythmia, orthostasis, neurocardiogenic, or vasovagal. Orthostasis has resolved- in fact she is quite hypertensive today. Recommend resuming bisoprolol 5 mg daily without HCTZ. Stop Florinef. Will arrange for 30 day event monitor and Echocardiogram. I will follow up in 6 weeks. 2. Severe HTN- uncontrolled. Clearly she needs to be on some BP med. See #1.  3. Atypical chest pain. Moderate cardiac risk with history of HTN and HL. I reviewed prior chest CT scans and there is no calcification of coronary arteries. Would consider stress testing on future follow up once BP controlled. 4. Hyperlipidemia. Excellent control on Crestor. 5. S/p coiling of pulmonary AVM 2015 6. S/p CVA x 2 in 2006 and 2015.     Medication Adjustments/Labs and Tests Ordered: Current medicines are reviewed at length with the patient today.  Concerns regarding medicines are outlined above.  Medication changes, Labs and Tests ordered today are listed in the Patient Instructions below. Patient Instructions  We will have you wear an event monitor for 30 days  We will schedule you for an Echocardiogram  Stop taking Florinef  Start bisoprolol 5 mg daily  I will see you in about 6 weeks.      Signed, Shamira Toutant [place], MD  02/02/2016 9:52 AM    [facility] [address] [place], [place], [address] [phone]

## 2016-02-02 ENCOUNTER — Encounter: Payer: Self-pay | Admitting: Cardiology

## 2016-02-02 ENCOUNTER — Ambulatory Visit (INDEPENDENT_AMBULATORY_CARE_PROVIDER_SITE_OTHER): Payer: Managed Care, Other (non HMO) | Admitting: Cardiology

## 2016-02-02 VITALS — BP 158/110 | HR 89 | Ht 63.0 in | Wt 145.0 lb

## 2016-02-02 DIAGNOSIS — R55 Syncope and collapse: Secondary | ICD-10-CM

## 2016-02-02 DIAGNOSIS — I10 Essential (primary) hypertension: Secondary | ICD-10-CM

## 2016-02-02 MED ORDER — BISOPROLOL FUMARATE 5 MG PO TABS: 5.0000 mg | ORAL_TABLET | Freq: Every day | ORAL | 11 refills | Status: DC

## 2016-02-02 NOTE — Progress Notes (Signed)
Called spoke with pt. Reviewed results and recs. Pt voiced understanding and had no further questions.

## 2016-02-02 NOTE — Patient Instructions (Signed)
We will have you wear an event monitor for 30 days  We will schedule you for an Echocardiogram  Stop taking Florinef  Start bisoprolol 5 mg daily  I will see you in about 6 weeks.

## 2016-02-03 ENCOUNTER — Ambulatory Visit: Payer: Managed Care, Other (non HMO) | Admitting: Cardiology

## 2016-02-16 ENCOUNTER — Ambulatory Visit (HOSPITAL_COMMUNITY): Payer: Managed Care, Other (non HMO) | Attending: Cardiology

## 2016-02-16 ENCOUNTER — Other Ambulatory Visit: Payer: Self-pay

## 2016-02-16 ENCOUNTER — Ambulatory Visit (INDEPENDENT_AMBULATORY_CARE_PROVIDER_SITE_OTHER): Payer: Managed Care, Other (non HMO)

## 2016-02-16 DIAGNOSIS — I119 Hypertensive heart disease without heart failure: Secondary | ICD-10-CM | POA: Diagnosis not present

## 2016-02-16 DIAGNOSIS — R55 Syncope and collapse: Secondary | ICD-10-CM | POA: Diagnosis not present

## 2016-02-16 DIAGNOSIS — I340 Nonrheumatic mitral (valve) insufficiency: Secondary | ICD-10-CM | POA: Diagnosis not present

## 2016-02-16 DIAGNOSIS — I10 Essential (primary) hypertension: Secondary | ICD-10-CM

## 2016-02-16 DIAGNOSIS — E785 Hyperlipidemia, unspecified: Secondary | ICD-10-CM | POA: Insufficient documentation

## 2016-02-16 DIAGNOSIS — Z720 Tobacco use: Secondary | ICD-10-CM | POA: Insufficient documentation

## 2016-02-16 DIAGNOSIS — I071 Rheumatic tricuspid insufficiency: Secondary | ICD-10-CM | POA: Insufficient documentation

## 2016-03-16 ENCOUNTER — Encounter: Payer: Self-pay | Admitting: Cardiology

## 2016-03-21 NOTE — Progress Notes (Signed)
Cardiology Office Note    Date:  03/23/2016   ID:  Priscilla Duke, DOB 1961-01-22, MRN [id number]  PCP:  Raynelle Jan., MD  Cardiologist:  Malina Geers [place], MD    History of Present Illness:  Priscilla Duke is a 55 y.o. female seen for follow up of syncope. She has a history of HTN, HL, and prior CVA x 2. 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 She was doing well until October when after taking a bath she went to her bedroom and opened a drawer in her dresser. She turned around and passed out. Initially hit on her knees then went down completely. No warning symptoms. Immediately afterwards she felt extremely dizzy and nauseous. This lasted only a few seconds. She was seen in pulmonary office and her bisoprolol HCT was discontinued. She was also seen by Neurology and started on Florinef. When seen in follow up she was quite hypertensive and bisoprolol was resumed and Florinef was stopped. Echo was unremarkable. Event monitor was also normal.  She has no further dizziness or syncope. She does have some pain in the Xyphoid area that radiates into her right chest and is worse with lying on her right side.    Past Medical History:  Diagnosis Date  . Allergy    skin, scalp  . Anxiety   . Arthritis    "down my entire spine; hands, knees" (04/04/2014)  . Chronic bronchitis (HCC)    "get it q winter"  . Colonic polyp   . Depression   . Didelphic uterus    Dr. Madelaine Etienne  . GERD (gastroesophageal reflux disease)   . Headache    "at least weekly" (04/04/2014)  . Heart murmur    as a child and "when I get sick"  . 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 Hypercholesterolemia   . Hypertension   . IBS (irritable bowel syndrome)   . Inguinal hernia    right   . Migraine    "a few times/yr" (04/04/2014)  . Mild cognitive impairment since 2006 stroke  . PONV (postoperative nausea and vomiting)    "just after hemorrhoidectomy"  . Seizures (HCC)    "none  for years now" (04/04/2014)  . Shortness of breath dyspnea   . Stroke [facility]) 2006; 01/04/2014   "mild word finding problems since; mild weakness right side" (04/04/2014)    Past Surgical History:  Procedure Laterality Date  . ABDOMINOPLASTY  2007  . ANAL FISSURE REPAIR  1981   due to tear  . AUGMENTATION MAMMAPLASTY  2007  . BREAST IMPLANT REMOVAL  05/2009   w/lift  . COLONOSCOPY W/ BIOPSIES AND POLYPECTOMY    . EXCISIONAL HEMORRHOIDECTOMY  ~ 2009  . RADIOLOGY WITH ANESTHESIA  04/04/2014   transcatheter embolization of right middle lobe AVM  . RADIOLOGY WITH ANESTHESIA N/A 04/04/2014   Procedure: RADIOLOGY WITH ANESTHESIA;  Surgeon: Reola Calkins, MD;  Location: [facility] OR;  Service: Radiology;  Laterality: N/A;  DR. Fredia Sorrow PERFORMING  . TUBAL LIGATION  1998  . VAGINAL HYSTERECTOMY  2003    Current Medications: Outpatient Medications Prior to Visit  Medication Sig Dispense Refill  . aspirin 81 MG tablet Take 81 mg by mouth every morning.    Marland Kitchen azithromycin (ZITHROMAX) 250 MG tablet Take both tablets (500mg ) 1 hour before dental procedures 2 tablet 0  . bisoprolol (ZEBETA) 5 MG tablet Take 1 tablet (5 mg total) by mouth daily. 30 tablet 11  .  EPINEPHrine (EPIPEN 2-PAK) 0.3 mg/0.3 mL DEVI Use as directed     . escitalopram (LEXAPRO) 10 MG tablet 10mg  in the morning and 30mg  in the evening    . memantine (NAMENDA) 10 MG tablet Take 10 mg by mouth daily.      . Omega-3 Fatty Acids (FISH OIL) 1000 MG CAPS Take 1 capsule by mouth daily.    . rosuvastatin (CRESTOR) 10 MG tablet Take 10 mg by mouth at bedtime.     . VENTOLIN HFA 108 (90 BASE) MCG/ACT inhaler Inhale 2 puffs into the lungs every 4 (four) hours as needed.  5  . zonisamide (ZONEGRAN) 100 MG capsule Take 300 mg by mouth 2 (two) times daily.     desonide (DESOWEN) 0.05 % ointment Apply 1 application topically 3 (three) times daily.  1   No facility-administered medications prior to visit.      Allergies:   Penicillins;  Shellfish allergy; Lipitor [atorvastatin]; and Olive oil   Social History   Social History  . Marital status: Married    Spouse name: N/A  . Number of children: 5  . Years of education: N/A   Occupational History  . Disability, former speech pathologist Disabled   Social History Main Topics  . Smoking status: Light Tobacco Smoker    Packs/day: 1.00    Years: 20.00    Types: Cigarettes, E-cigarettes    Last attempt to quit: 01/04/2014  . Smokeless tobacco: Never Used     Comment: vapes daily  . Alcohol use 4.2 oz/week    7 Glasses of wine per week  . Drug use: No  . Sexual activity: Yes   Other Topics Concern  . None   Social History Narrative  . None     Family History:  The patient's family history includes Aneurysm in her sister; Asthma in her brother; Hypertension in her mother and other; Migraines in her mother; Seizures in her father; Stroke in her mother.   ROS:   Please see the history of present illness.    ROS All other systems reviewed and are negative.   PHYSICAL EXAM:   VS:  BP 130/88 (BP Location: Right Arm, Patient Position: Sitting, Cuff Size: Normal)   Pulse 68   Ht 5\' 3"  (1.6 m)   Wt 144 lb (65.3 kg)   SpO2 98%   BMI 25.51 kg/m    Orthostatic vitals Supine BP 178/110. Pulse 88 Sitting BP 166/108 pulse 86 Standing BP 152/100 pulse 92. GEN: Well nourished, well developed, in no acute distress  HEENT: normal  Neck: no JVD, carotid bruits, or masses Cardiac: RRR; no murmurs, rubs, or gallops,no edema  There is some tenderness of the xyphoid process with palpation. Respiratory:  clear to auscultation bilaterally, normal work of breathing GI: soft, nontender, nondistended, + BS MS: no deformity or atrophy  Skin: warm and dry, no rash Neuro:  Alert and Oriented x 3, Strength and sensation are intact Psych: euthymic mood, full affect  Wt Readings from Last 3 Encounters:  03/23/16 144 lb (65.3 kg)  02/02/16 145 lb (65.8 kg)  01/29/16 145 lb  (65.8 kg)      Studies/Labs Reviewed:   EKG:  EKG is not ordered today.   Recent Labs: 01/29/2016: BUN 14; Creatinine, Ser 0.92; Hemoglobin 13.7; Platelets 254.0; Potassium 3.7; Sodium 138   Lipid Panel Dated 05/18/15: cholesterol 172, triglycerides 69, LDL 54, HDL 98  Additional studies/ records that were reviewed today include:    Echo: 10;30/17:  Study Conclusions  - Left ventricle: The cavity size was normal. There was mild focal   basal hypertrophy of the septum. Systolic function was normal.   The estimated ejection fraction was in the range of 60% to 65%.   Wall motion was normal; there were no regional wall motion   abnormalities. Doppler parameters are consistent with abnormal   left ventricular relaxation (grade 1 diastolic dysfunction).   Doppler parameters are consistent with elevated ventricular   end-diastolic filling pressure. - Aortic valve: Trileaflet; normal thickness leaflets. There was no   regurgitation. - Aortic root: The aortic root was normal in size. - Mitral valve: Calcified annulus. Mildly thickened leaflets .   There was mild regurgitation. - Left atrium: The atrium was normal in size. - Right ventricle: The cavity size was normal. Wall thickness was   normal. Systolic function was normal. - Right atrium: The atrium was normal in size. - Tricuspid valve: There was mild regurgitation. - Pulmonic valve: There was no regurgitation. - Pulmonary arteries: Systolic pressure was mildly increased. PA   peak pressure: 38 mm Hg (S). - Pericardium, extracardiac: There was no pericardial effusion.   Event monitor is normal. ASSESSMENT:    1. Essential hypertension   2. Syncope, unspecified syncope type   3. Pulmonary arteriovenous malformation      PLAN:  In order of problems listed above:  1. Syncope. Etiology unclear. Likely orthostatic secondary to BP meds. On less medication now and BP controlled.  Recommend continue bisoprolol 5 mg daily  without HCTZ.  Echo is unremarkable. Event monitor -normal.  I will follow up prn 2. HTN- controlled. 3. Atypical chest pain. Most c//w Xyphoid process inflammation. Reassured.  4. Hyperlipidemia. Excellent control on Crestor. 5. S/p coiling of pulmonary AVM 2015 6. S/p CVA x 2 in 2006 and 2015.     Medication Adjustments/Labs and Tests Ordered: Current medicines are reviewed at length with the patient today.  Concerns regarding medicines are outlined above.  Medication changes, Labs and Tests ordered today are listed in the Patient Instructions below. Patient Instructions  Continue your current therapy  I will see you as needed.     Signed, Tycen Dockter [place], MD  03/23/2016 3:21 PM    [facility] [address], [place], [place], [address] [phone]

## 2016-03-23 ENCOUNTER — Encounter: Payer: Self-pay | Admitting: Cardiology

## 2016-03-23 ENCOUNTER — Ambulatory Visit (INDEPENDENT_AMBULATORY_CARE_PROVIDER_SITE_OTHER): Payer: Managed Care, Other (non HMO) | Admitting: Cardiology

## 2016-03-23 VITALS — BP 130/88 | HR 68 | Ht 63.0 in | Wt 144.0 lb

## 2016-03-23 DIAGNOSIS — I10 Essential (primary) hypertension: Secondary | ICD-10-CM | POA: Diagnosis not present

## 2016-03-23 DIAGNOSIS — R55 Syncope and collapse: Secondary | ICD-10-CM | POA: Diagnosis not present

## 2016-03-23 DIAGNOSIS — Q2572 Congenital pulmonary arteriovenous malformation: Secondary | ICD-10-CM

## 2016-03-23 NOTE — Patient Instructions (Signed)
Continue your current therapy  I will see you as needed. 

## 2016-04-21 ENCOUNTER — Encounter: Payer: Self-pay | Admitting: Interventional Radiology

## 2016-05-12 ENCOUNTER — Other Ambulatory Visit: Payer: Self-pay | Admitting: Internal Medicine

## 2016-05-25 ENCOUNTER — Other Ambulatory Visit (HOSPITAL_COMMUNITY): Payer: Self-pay | Admitting: Interventional Radiology

## 2016-05-25 DIAGNOSIS — Q2730 Arteriovenous malformation, site unspecified: Secondary | ICD-10-CM

## 2016-06-02 ENCOUNTER — Other Ambulatory Visit: Payer: Self-pay | Admitting: Radiology

## 2016-06-02 DIAGNOSIS — Q2730 Arteriovenous malformation, site unspecified: Secondary | ICD-10-CM

## 2016-06-24 ENCOUNTER — Other Ambulatory Visit: Payer: Managed Care, Other (non HMO)

## 2016-06-24 ENCOUNTER — Encounter (HOSPITAL_COMMUNITY): Payer: Self-pay

## 2016-06-24 ENCOUNTER — Ambulatory Visit (HOSPITAL_COMMUNITY)
Admission: RE | Admit: 2016-06-24 | Discharge: 2016-06-24 | Disposition: A | Discharge status: Home / Self Care | Payer: Managed Care, Other (non HMO) | Source: Ambulatory Visit | Attending: Interventional Radiology | Admitting: Interventional Radiology

## 2016-08-04 ENCOUNTER — Other Ambulatory Visit: Payer: Self-pay | Admitting: Radiology

## 2016-08-04 DIAGNOSIS — Z8774 Personal history of (corrected) congenital malformations of heart and circulatory system: Secondary | ICD-10-CM

## 2016-08-17 ENCOUNTER — Ambulatory Visit
Admission: RE | Admit: 2016-08-17 | Discharge: 2016-08-17 | Disposition: A | Discharge status: Home / Self Care | Payer: Managed Care, Other (non HMO) | Source: Ambulatory Visit | Attending: Interventional Radiology | Admitting: Interventional Radiology

## 2016-08-17 ENCOUNTER — Ambulatory Visit (HOSPITAL_COMMUNITY)
Admission: RE | Admit: 2016-08-17 | Discharge: 2016-08-17 | Disposition: A | Discharge status: Home / Self Care | Payer: Managed Care, Other (non HMO) | Source: Ambulatory Visit | Attending: Interventional Radiology | Admitting: Interventional Radiology

## 2016-08-17 ENCOUNTER — Encounter (HOSPITAL_COMMUNITY): Payer: Self-pay

## 2016-08-17 DIAGNOSIS — Q2730 Arteriovenous malformation, site unspecified: Secondary | ICD-10-CM

## 2016-08-17 HISTORY — PX: IR RADIOLOGIST EVAL & MGMT: IMG5224

## 2016-08-17 MED ORDER — IOPAMIDOL (ISOVUE-370) INJECTION 76%
INTRAVENOUS | Status: AC
  Administered 2016-08-17: 75 mL via INTRAVENOUS
  Filled 2016-08-17: qty 100

## 2016-08-17 NOTE — Progress Notes (Signed)
Chief Complaint: Status post transcatheter embolization of right middle lobe pulmonary AV malformation on 04/04/2014.  History of Present Illness: Priscilla Duke is a 56 y.o. female now two and a half years post transcatheter embolization of a right middle lobe pulmonary AVM.  She has been doing well with complaint of occasional right lateral chest wall pain, especially at night.  She has been diagnosed with significant sleep apnea and is seeing Dr. Maple Hudson.  She is investigating treatment options.  She has had no respiratory or neurologic symptoms.   Past Medical History:  Diagnosis Date  . Allergy    skin, scalp  . Anxiety   . Arthritis    "down my entire spine; hands, knees" (04/04/2014)  . Chronic bronchitis (HCC)    "get it q winter"  . Colonic polyp   . Depression   . Didelphic uterus    Dr. Madelaine Etienne  . GERD (gastroesophageal reflux disease)   . Headache    "at least weekly" (04/04/2014)  . Heart murmur    as a child and "when I get sick"  . 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 Hypercholesterolemia   . Hypertension   . IBS (irritable bowel syndrome)   . Inguinal hernia    right   . Migraine    "a few times/yr" (04/04/2014)  . Mild cognitive impairment since 2006 stroke  . PONV (postoperative nausea and vomiting)    "just after hemorrhoidectomy"  . Seizures (HCC)    "none for years now" (04/04/2014)  . Shortness of breath dyspnea   . Stroke [facility]) 2006; 01/04/2014   "mild word finding problems since; mild weakness right side" (04/04/2014)    Past Surgical History:  Procedure Laterality Date  . ABDOMINOPLASTY  2007  . ANAL FISSURE REPAIR  1981   due to tear  . AUGMENTATION MAMMAPLASTY  2007  . BREAST IMPLANT REMOVAL  05/2009   w/lift  . COLONOSCOPY W/ BIOPSIES AND POLYPECTOMY    . EXCISIONAL HEMORRHOIDECTOMY  ~ 2009  . IR GENERIC HISTORICAL  05/08/2014   IR RADIOLOGIST EVAL & MGMT 05/08/2014 [name], MD [facility] INTERV RAD  .  RADIOLOGY WITH ANESTHESIA  04/04/2014   transcatheter embolization of right middle lobe AVM  . RADIOLOGY WITH ANESTHESIA N/A 04/04/2014   Procedure: RADIOLOGY WITH ANESTHESIA;  Surgeon: Reola Calkins, MD;  Location: [facility] OR;  Service: Radiology;  Laterality: N/A;  DR. Fredia Sorrow PERFORMING  . TUBAL LIGATION  1998  . VAGINAL HYSTERECTOMY  2003    Allergies: Penicillins; Shellfish allergy; Lipitor [atorvastatin]; and Olive oil  Medications: Prior to Admission medications   Medication Sig Start Date End Date Taking? Authorizing Provider  aspirin 81 MG tablet Take 81 mg by mouth every morning.   Yes Historical Provider, MD  azithromycin (ZITHROMAX) 250 MG tablet Take both tablets (500mg ) 1 hour before dental procedures 01/15/16  Yes 01/17/16, MD  bisoprolol (ZEBETA) 5 MG tablet Take 1 tablet (5 mg total) by mouth daily. 02/02/16  Yes Peter M 02/04/16, MD  EPINEPHrine (EPIPEN 2-PAK) 0.3 mg/0.3 mL DEVI Use as directed    Yes Historical Provider, MD  escitalopram (LEXAPRO) 10 MG tablet 10mg  in the morning and 30mg  in the evening   Yes Historical Provider, MD  memantine (NAMENDA) 10 MG tablet Take 10 mg by mouth daily.     Yes Historical Provider, MD  rosuvastatin (CRESTOR) 10 MG tablet Take 10 mg by mouth at bedtime.    Yes  Historical Provider, MD  zonisamide (ZONEGRAN) 100 MG capsule Take 300 mg by mouth 2 (two) times daily.    Yes Historical Provider, MD  Omega-3 Fatty Acids (FISH OIL) 1000 MG CAPS Take 1 capsule by mouth daily.    Historical Provider, MD  VENTOLIN HFA 108 (90 BASE) MCG/ACT inhaler Inhale 2 puffs into the lungs every 4 (four) hours as needed. 04/16/14   Historical Provider, MD     Family History  Problem Relation Age of Onset  . Aneurysm Sister   . Hypertension Other   . Hypertension Mother   . Stroke Mother   . Migraines Mother   . Seizures Father   . Asthma Brother     Social History   Social History  . Marital status: Married    Spouse name: N/A  . Number  of children: 5  . Years of education: N/A   Occupational History  . Disability, former speech pathologist Disabled   Social History Main Topics  . Smoking status: Light Tobacco Smoker    Packs/day: 1.00    Years: 20.00    Types: Cigarettes, E-cigarettes    Last attempt to quit: 01/04/2014  . Smokeless tobacco: Never Used     Comment: vapes daily  . Alcohol use 4.2 oz/week    7 Glasses of wine per week  . Drug use: No  . Sexual activity: Yes   Other Topics Concern  . Not on file   Social History Narrative  . No narrative on file    Review of Systems: A 12 point ROS discussed and pertinent positives are indicated in the HPI above.  All other systems are negative.  Review of Systems  Constitutional: Negative.   HENT: Negative.   Respiratory: Negative.   Cardiovascular: Negative for palpitations and leg swelling.       Occasional right lateral/axillary chest wall pain.  Gastrointestinal: Negative.   Genitourinary: Negative.   Musculoskeletal: Negative.   Neurological: Negative.     Vital Signs: BP (!) 136/54 (BP Location: Left Arm, Patient Position: Sitting, Cuff Size: Normal)   Pulse 76   Temp 98.3 F (36.8 C) (Oral)   Resp 15   SpO2 99%   Physical Exam  Constitutional: She is oriented to person, place, and time. She appears well-developed and well-nourished. No distress.  Cardiovascular: Normal rate, regular rhythm and normal heart sounds.  Exam reveals no gallop and no friction rub.   No murmur heard. Pulmonary/Chest: Effort normal and breath sounds normal. No respiratory distress. She has no wheezes. She has no rales.  Neurological: She is alert and oriented to person, place, and time.  Skin: She is not diaphoretic.  Vitals reviewed.   Imaging: Ct Angio Chest Aorta W/cm &/or Wo/cm  Result Date: 08/17/2016 CLINICAL DATA:  Status post transcatheter embolization of right middle lobe pulmonary AV malformation on 04/04/2014. EXAM: CT ANGIOGRAPHY CHEST WITH  CONTRAST TECHNIQUE: Multidetector CT imaging of the chest was performed using the standard protocol during bolus administration of intravenous contrast. Multiplanar CT image reconstructions and MIPs were obtained to evaluate the vascular anatomy. CONTRAST:  75 mL Isovue 370 IV COMPARISON:  12/03/2014 FINDINGS: Cardiovascular: Stable configuration of embolization coil mass in the medial and anterior aspect of the right middle lobe with no evidence of further perfusion of the treated AV malformation. No new pulmonary AV malformations identified. No evidence of pulmonary embolism. Central pulmonary arteries are normal in caliber. The heart size is normal.  No pericardial fluid identified. Mediastinum/Nodes: No evidence of  mediastinal masses. No mediastinal, hilar or axillary lymphadenopathy identified. Lungs/Pleura: There is no evidence of pulmonary edema, consolidation, pneumothorax, nodule or pleural fluid. Upper Abdomen: No acute abnormality. Musculoskeletal: No abnormalities are seen involving the chest wall or visualized spine. Review of the MIP images confirms the above findings. IMPRESSION: Stable configuration of embolization coils within the previously treated right middle lobe AVM. No evidence of reperfusion of the AV malformation or new pulmonary AV malformations. Electronically Signed   By: [name] M.D.   On: 08/17/2016 16:51    Labs:  CBC:  Recent Labs  01/29/16 1635  WBC 7.6  HGB 13.7  HCT 40.5  PLT 254.0    COAGS: No results for input(s): INR, APTT in the last 8760 hours.  BMP:  Recent Labs  01/29/16 1635  NA 138  K 3.7  CL 101  CO2 32  GLUCOSE 142*  BUN 14  CALCIUM 9.8  CREATININE 0.92    Assessment and Plan:  CTA of the chest earlier today shows stable occlusion of the RML AVM with no evidence of reperfusion.  No additional AV malformations identified.  I told Share that I do not think her lateral chest wall/axillary symptoms are related to prior embolization  of a medial/anteriorly located RML AVM.  I recommended a follow up CTA in 5 years.  I encouraged her to continue to seek advice on treatment of her sleep apnea, which could be contributing to some of her symptoms.  She plans to follow up with Dr. Maple Hudson.  Electronically SignedIrish Lack T 08/17/2016, 5:19 PM   I spent a total of 25 Minutes in face to face in clinical consultation, greater than 50% of which was counseling/coordinating care post embolization of a pulmonary AVM.

## 2016-10-01 ENCOUNTER — Encounter: Payer: Self-pay | Admitting: Interventional Radiology

## 2016-12-13 ENCOUNTER — Telehealth: Payer: Self-pay | Admitting: Internal Medicine

## 2016-12-13 NOTE — Telephone Encounter (Signed)
Spoke with patient. Explained to patient's CY recs. She wishes to proceed with the oral appliance fitting.   CY, is ok for me to place a referral for her? I looked at her problem list and I do not see OSA listed. Just wanted to double check before I place the referral. Thanks!

## 2016-12-13 NOTE — Telephone Encounter (Signed)
She might want to talk with Dr Althea Grimmer, orthodontist, about a fitted oral appliance to treat sleep apnea. No chest pressure with that.

## 2016-12-13 NOTE — Telephone Encounter (Signed)
Unable to pull sleep study from Care Everywhere.   lmtcb X1 for pt to relay CY's recs.

## 2016-12-13 NOTE — Telephone Encounter (Signed)
I saw Care everywhere listed a [facility] associated doctor had seen her for OSA. If you can dig up a sleep study showing the diagonosis, probably also in care everywhere, then ok to make the referral. Otherwise she should just ask the doctor who is treating her for this problem to make a referral.

## 2016-12-13 NOTE — Telephone Encounter (Signed)
Spoke with patient. She had questions about a CPAP machine and her lungs status. She has had a coil placed into her lung to help spot the blood clots she was having (which caused multiple strokes). She was advised after this procedure to never have any pressure on her chest. She was diagnosed with OSA and was advised to try a CPAP machine. Her son is currently on a CPAP machine and told her that the machine does produce a lot of pressure on the chest area.   She wants to know CY's thoughts on this. And if a CPAP is not useful in her case, what else could she do to help with her OSA?   CY, please advise. Thanks!

## 2016-12-15 NOTE — Telephone Encounter (Signed)
Spoke with the pt and notified of recs per CDY and she verbalized understanding  I advised probably best if she schedules appt with [place] once she gets the sleep study report  She agrees with this plan and will call when she gets the study  Nothing further needed at this time

## 2016-12-15 NOTE — Telephone Encounter (Signed)
If she was advised to use CPAP by someone, then that doctor needs to address this issue for her. We can ask her to get the report of sleep study from wherever it was done, because we would need it if we were to refer her for an oral appliance.

## 2016-12-15 NOTE — Telephone Encounter (Signed)
Called and spoke with pt. Pt states she had sleep study a years of so ago, however we are unable to pull sleep study. Pt states is concerned about starting cpap, as pt was instructed to avoid pressure on lungs.   CY please advise. Thanks.

## 2016-12-15 NOTE — Telephone Encounter (Signed)
LMTCB

## 2016-12-15 NOTE — Telephone Encounter (Signed)
Patient stated Dr. Hyacinth Meeker is in [place], [place].

## 2017-01-20 ENCOUNTER — Telehealth: Payer: Self-pay | Admitting: Internal Medicine

## 2017-01-20 ENCOUNTER — Other Ambulatory Visit: Payer: Self-pay | Admitting: Internal Medicine

## 2017-01-20 MED ORDER — DOXYCYCLINE HYCLATE 100 MG PO TABS: ORAL_TABLET | ORAL | 5 refills | Status: DC

## 2017-01-20 NOTE — Telephone Encounter (Signed)
Ok to repeat what was done last time, and make it refillable.

## 2017-01-20 NOTE — Telephone Encounter (Signed)
Spoke with pt.  She needs Doxycycline rx sent to pharmacy prior to dental work today.  Pt states that she takes this due to coiling of pulmonary venous formation in 2015.  Pt states that pharmacy told her that Dr Maple Hudson filled the last rx for this.  She takes 2 pills prior to dental work.  Please advise if ok to send rx.  Allergies  Allergen Reactions  . Penicillins Shortness Of Breath, Itching and Rash  . Shellfish Allergy Shortness Of Breath and Itching  . Lipitor [Atorvastatin] Other (See Comments)    SOB, leg cramps  . Olive Oil Diarrhea and Nausea And Vomiting    Current Outpatient Prescriptions on File Prior to Visit  Medication Sig Dispense Refill  . aspirin 81 MG tablet Take 81 mg by mouth every morning.    Marland Kitchen azithromycin (ZITHROMAX) 250 MG tablet Take both tablets (500mg ) 1 hour before dental procedures 2 tablet 0  . bisoprolol (ZEBETA) 5 MG tablet Take 1 tablet (5 mg total) by mouth daily. 30 tablet 11  . EPINEPHrine (EPIPEN 2-PAK) 0.3 mg/0.3 mL DEVI Use as directed     . escitalopram (LEXAPRO) 10 MG tablet 10mg  in the morning and 30mg  in the evening    . memantine (NAMENDA) 10 MG tablet Take 10 mg by mouth daily.      . Omega-3 Fatty Acids (FISH OIL) 1000 MG CAPS Take 1 capsule by mouth daily.    . rosuvastatin (CRESTOR) 10 MG tablet Take 10 mg by mouth at bedtime.     . VENTOLIN HFA 108 (90 BASE) MCG/ACT inhaler Inhale 2 puffs into the lungs every 4 (four) hours as needed.  5  . zonisamide (ZONEGRAN) 100 MG capsule Take 300 mg by mouth 2 (two) times daily.      No current facility-administered medications on file prior to visit.

## 2017-01-20 NOTE — Telephone Encounter (Signed)
Spoke with pt and advised that Doxycycline rx was sent to pharmacy.  PT verbalized understanding.  Nothing further needed.

## 2017-09-19 ENCOUNTER — Other Ambulatory Visit: Payer: Self-pay | Admitting: Cardiology

## 2017-09-19 NOTE — Telephone Encounter (Signed)
Rx sent to pharmacy   

## 2017-11-29 ENCOUNTER — Ambulatory Visit (INDEPENDENT_AMBULATORY_CARE_PROVIDER_SITE_OTHER): Payer: Managed Care, Other (non HMO) | Admitting: Primary Care

## 2017-11-29 ENCOUNTER — Encounter: Payer: Self-pay | Admitting: Primary Care

## 2017-11-29 VITALS — BP 126/80 | HR 67 | Ht 63.75 in | Wt 128.8 lb

## 2017-11-29 DIAGNOSIS — R918 Other nonspecific abnormal finding of lung field: Secondary | ICD-10-CM | POA: Insufficient documentation

## 2017-11-29 DIAGNOSIS — G4733 Obstructive sleep apnea (adult) (pediatric): Secondary | ICD-10-CM

## 2017-11-29 DIAGNOSIS — Q2572 Congenital pulmonary arteriovenous malformation: Secondary | ICD-10-CM

## 2017-11-29 DIAGNOSIS — R05 Cough: Secondary | ICD-10-CM

## 2017-11-29 DIAGNOSIS — R0602 Shortness of breath: Secondary | ICD-10-CM | POA: Diagnosis not present

## 2017-11-29 DIAGNOSIS — R059 Cough, unspecified: Secondary | ICD-10-CM

## 2017-11-29 DIAGNOSIS — G4730 Sleep apnea, unspecified: Secondary | ICD-10-CM

## 2017-11-29 MED ORDER — FLUTTER DEVI: 0 refills | Status: AC

## 2017-11-29 MED ORDER — FLUTTER DEVI
0 refills | Status: DC
Start: 2017-11-29 — End: 2017-11-29

## 2017-11-29 NOTE — Assessment & Plan Note (Addendum)
Routine CTA showed mild bronchiectasis in the right upper lobe FU HRCT in 6 months Recommend mucinex and flutter valve

## 2017-11-29 NOTE — Patient Instructions (Addendum)
Use Flutter valve three times a day, 5-10 breaths   Take mucinex twice a day- this is an expectorant for congestion   HRCT in 6 months re: abnormal findings on CT and FU with Dr. Maple Hudson   Needs dental appliance for OSA

## 2017-11-29 NOTE — Progress Notes (Signed)
@Patient  ID: , female    DOB: 25-May-1960, 57 y.o.   MRN: [id number]  Chief Complaint  Patient presents with  . Follow-up    SOB, cough and mucous    Referring provider: [id number]., MD  HPI: 57 year old female, former smoker (quit 2015). PMH pulmonary arteriovenous malformation, embolitic CVA, chronic bronchitis, hemoptysis, seasonal allergic rhinitis. Patient of Dr. 2016, last seen in office by pulmonary NP on 01/2016. Referred by Dr. Yamagata/radiology for pulmonary management after coiling of a pulmonary venous malformation.  In December 2015 patient had coiling of her venous malformation. She became more aware of shortness of breath with exertion and increased cough. There was transient hemoptysis after the coiling procedure, now resolved. History of bronchitis since childhood. Parents smoked. Brother had asthma she denies history of pneumonia.  01/29/2016 Acute OV  Pt presents for an acute office visit. Says that yesterday she was putting up close bent down to pick up clothes and when she stood up got lightheaded and passed out.  Woke up as she was going down on her knees. She had no chest pain , visual/speech changes or arm weakness. She did feel dizzy when she stood back up . She is on b/p meds.  Says went to ER couple of months ago in [place] with chest pain, told it was not cardiac.  Says she was seen by cardiology with cardiac cath that she was told it was normal .  She is worried that her previous pulmonary AVM coliling has caused this. She denies flare of cough . No hemoptysis.  Says she was recenlty told she had OSA but she refused CPAP .   11/30/2017  Patient presents today for follow up visit, she is concerned with recent results on routine CTA monitoring AV malformation. Reviewed results with patient in room, CTA showed single bronchi with mild bronchiectasis in the right upper lobe. No consolidation, effusion, pneumothorax. Hx chronic bronchitis. She  has some congestion and chronic cough. Experiences episodes of apnea, she has known OSA but unable to use CPAP due to coil. Needs referral for dental appliance. Recent flare of fatigue. Follows with rheumatology for possible lupus. She's had positive ANA in 2016.   Significant testing reviewed: >> CTA 08/17/16 - showed stable configuration of embolization coils within the previously treated right middle lobe AVM. No evidence of reperfusion of the AV malformation or new pulmonary AV malformations. No evidence of pulmonary edema, pneumothorax, consolidation, nodule or pleural fluid >> PFTs in 2016 - FVC 3.3 (100%), FEV1 2.72 (105%), ratio 82; showed no obstruction or restriction  >> Normal allergy and food allergen panel in 2016, IgE 9     Allergies  Allergen Reactions  . Penicillins Shortness Of Breath, Itching and Rash  . Shellfish Allergy Shortness Of Breath and Itching  . Lipitor [Atorvastatin] Other (See Comments)    SOB, leg cramps  . Olive Oil Diarrhea and Nausea And Vomiting    Immunization History  Administered Date(s) Administered  . Pneumococcal Polysaccharide-23 03/05/2015  . Td 02/21/2007    Past Medical History:  Diagnosis Date  . Allergy    skin, scalp  . Anxiety   . Arthritis    "down my entire spine; hands, knees" (04/04/2014)  . Chronic bronchitis (HCC)    "get it q winter"  . Colonic polyp   . Depression   . Didelphic uterus    Dr. 07-05-1985  . GERD (gastroesophageal reflux disease)   . Headache    "  at least weekly" (04/04/2014)  . Heart murmur    as a child and "when I get sick"  . 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 Hypercholesterolemia   . Hypertension   . IBS (irritable bowel syndrome)   . Inguinal hernia    right   . Migraine    "a few times/yr" (04/04/2014)  . Mild cognitive impairment since 2006 stroke  . PONV (postoperative nausea and vomiting)    "just after hemorrhoidectomy"  . Seizures (HCC)    "none for years  now" (04/04/2014)  . Shortness of breath dyspnea   . Stroke [facility]) 2006; 01/04/2014   "mild word finding problems since; mild weakness right side" (04/04/2014)    Tobacco History: Social History   Tobacco Use  Smoking Status Light Tobacco Smoker  . Packs/day: 1.00  . Years: 20.00  . Pack years: 20.00  . Types: Cigarettes, E-cigarettes  . Last attempt to quit: 01/04/2014  . Years since quitting: 3.9  Smokeless Tobacco Never Used  Tobacco Comment   vapes daily   Ready to quit: Not Answered Counseling given: Not Answered Comment: vapes daily   Outpatient Medications Prior to Visit  Medication Sig Dispense Refill  . aspirin 81 MG tablet Take 81 mg by mouth every morning.    Marland Kitchen azithromycin (ZITHROMAX) 250 MG tablet Take both tablets (500mg ) 1 hour before dental procedures 2 tablet 0  . bisoprolol (ZEBETA) 5 MG tablet TAKE 1 TABLET BY MOUTH DAILY. 30 tablet 8  . doxycycline (VIBRA-TABS) 100 MG tablet Take 2 tablets 1 hr prior to dental procedure 2 tablet 5  . EPINEPHrine (EPIPEN 2-PAK) 0.3 mg/0.3 mL DEVI Use as directed     . erythromycin ophthalmic ointment Place 1 application into both eyes at bedtime.    Marland Kitchen escitalopram (LEXAPRO) 10 MG tablet 10mg  in the morning and 30mg  in the evening    . memantine (NAMENDA) 10 MG tablet Take 10 mg by mouth daily.      . Omega-3 Fatty Acids (FISH OIL) 1000 MG CAPS Take 1 capsule by mouth daily.    . predniSONE (STERAPRED UNI-PAK 21 TAB) 10 MG (21) TBPK tablet Take 10 mg by mouth as directed.    . rosuvastatin (CRESTOR) 10 MG tablet Take 10 mg by mouth at bedtime.     . VENTOLIN HFA 108 (90 BASE) MCG/ACT inhaler Inhale 2 puffs into the lungs every 4 (four) hours as needed.  5  . zonisamide (ZONEGRAN) 100 MG capsule Take 300 mg by mouth 2 (two) times daily.      No facility-administered medications prior to visit.     Review of Systems  Review of Systems  Constitutional: Positive for fatigue. Negative for chills, diaphoresis and fever.    HENT: Negative.   Respiratory: Positive for apnea and cough.   Cardiovascular: Negative.   Skin: Negative.    Physical Exam  BP 126/80 (BP Location: Left Arm, Cuff Size: Normal)   Pulse 67   Ht 5' 3.75" (1.619 m)   Wt 128 lb 12.8 oz (58.4 kg)   SpO2 98%   BMI 22.28 kg/m  Physical Exam  Constitutional: She is oriented to person, place, and time. She appears well-developed and well-nourished.  Appears well, no distress  HENT:  Head: Normocephalic and atraumatic.  Mallampati class I  Eyes: Pupils are equal, round, and reactive to light. EOM are normal.  Neck: Normal range of motion. Neck supple.  Cardiovascular: Normal rate, regular rhythm and normal heart  sounds.  No murmur heard. Pulmonary/Chest: Effort normal and breath sounds normal. No respiratory distress. She has no wheezes.  Abdominal: Soft. Bowel sounds are normal. There is no tenderness.  Neurological: She is alert and oriented to person, place, and time.  Skin: Skin is warm and dry. No rash noted. No erythema.  Psychiatric: She has a normal mood and affect. Her behavior is normal. Judgment normal.     Lab Results:  CBC    Component Value Date/Time   WBC 7.6 01/29/2016 1635   RBC 4.58 01/29/2016 1635   HGB 13.7 01/29/2016 1635   HCT 40.5 01/29/2016 1635   PLT 254.0 01/29/2016 1635   MCV 88.4 01/29/2016 1635   MCH 27.5 04/08/2014 0715   MCHC 33.9 01/29/2016 1635   RDW 13.5 01/29/2016 1635   LYMPHSABS 1.7 01/29/2016 1635   MONOABS 0.6 01/29/2016 1635   EOSABS 0.1 01/29/2016 1635   BASOSABS 0.0 01/29/2016 1635    BMET    Component Value Date/Time   NA 138 01/29/2016 1635   K 3.7 01/29/2016 1635   CL 101 01/29/2016 1635   CO2 32 01/29/2016 1635   GLUCOSE 142 (H) 01/29/2016 1635   BUN 14 01/29/2016 1635   CREATININE 0.92 01/29/2016 1635   CALCIUM 9.8 01/29/2016 1635   GFRNONAA 75 (L) 04/08/2014 0715   GFRAA 87 (L) 04/08/2014 0715    BNP No results found for: BNP  ProBNP No results found for:  PROBNP  Imaging: No results found.   Assessment & Plan:   Abnormal CT scan of lung Routine CTA showed mild bronchiectasis in the right upper lobe FU HRCT in 6 months Recommend mucinex and flutter valve   Pulmonary arteriovenous malformation -Stable on most recent CTA  Apnea, sleep Hx OSA, reports that she can not use CPAP due AVM coil - Needs referral for dental appliance - If sleep study out of date will need to repeat HST       Glenford Bayley, NP 11/30/2017

## 2017-11-30 ENCOUNTER — Encounter: Payer: Self-pay | Admitting: Primary Care

## 2017-11-30 DIAGNOSIS — G4730 Sleep apnea, unspecified: Secondary | ICD-10-CM | POA: Insufficient documentation

## 2017-11-30 NOTE — Assessment & Plan Note (Signed)
-  Stable on most recent CTA

## 2017-11-30 NOTE — Assessment & Plan Note (Signed)
Hx OSA, reports that she can not use CPAP due AVM coil - Needs referral for dental appliance - If sleep study out of date will need to repeat HST

## 2017-11-30 NOTE — Addendum Note (Signed)
Addended by: Earvin Hansen on: 11/30/2017 12:02 PM   Modules accepted: Orders

## 2017-12-08 ENCOUNTER — Ambulatory Visit: Payer: Managed Care, Other (non HMO) | Admitting: Adult Health

## 2017-12-26 DIAGNOSIS — G4733 Obstructive sleep apnea (adult) (pediatric): Secondary | ICD-10-CM

## 2017-12-27 DIAGNOSIS — G4733 Obstructive sleep apnea (adult) (pediatric): Secondary | ICD-10-CM | POA: Diagnosis not present

## 2017-12-30 ENCOUNTER — Other Ambulatory Visit: Payer: Self-pay | Admitting: *Deleted

## 2017-12-30 DIAGNOSIS — G4733 Obstructive sleep apnea (adult) (pediatric): Secondary | ICD-10-CM

## 2018-01-10 ENCOUNTER — Telehealth: Payer: Self-pay | Admitting: Internal Medicine

## 2018-01-10 NOTE — Telephone Encounter (Signed)
Attempted to call patient back , no answer, left message to call back .

## 2018-01-11 NOTE — Telephone Encounter (Signed)
Pt is requesting her sleep study results.  Beth - please advise. Thanks!

## 2018-01-11 NOTE — Telephone Encounter (Signed)
Home sleep study showed mild sleep apnea, she should schedule a follow up apt to discuss treatment options. Thanks

## 2018-01-11 NOTE — Telephone Encounter (Signed)
Spoke with pt. She is aware of her sleep study results. OV has been scheduled for 01/16/18 at 4:30pm. Nothing further was needed at this time.

## 2018-01-16 ENCOUNTER — Ambulatory Visit (INDEPENDENT_AMBULATORY_CARE_PROVIDER_SITE_OTHER): Payer: Managed Care, Other (non HMO) | Admitting: Primary Care

## 2018-01-16 ENCOUNTER — Encounter: Payer: Self-pay | Admitting: Primary Care

## 2018-01-16 DIAGNOSIS — I10 Essential (primary) hypertension: Secondary | ICD-10-CM | POA: Diagnosis not present

## 2018-01-16 DIAGNOSIS — Z720 Tobacco use: Secondary | ICD-10-CM

## 2018-01-16 NOTE — Patient Instructions (Addendum)
FU in 4 month for OSA review and smoking cessation   BP elevated today 162/90 Monitor BP over the next 1-2 days and call PCP if remains elevated >140/80  STOP vaping, I know you can do it!!! :)    Steps to Quit Smoking Smoking tobacco can be harmful to your health and can affect almost every organ in your body. Smoking puts you, and those around you, at risk for developing many serious chronic diseases. Quitting smoking is difficult, but it is one of the best things that you can do for your health. It is never too late to quit. What are the benefits of quitting smoking? When you quit smoking, you lower your risk of developing serious diseases and conditions, such as:  Lung cancer or lung disease, such as COPD.  Heart disease.  Stroke.  Heart attack.  Infertility.  Osteoporosis and bone fractures.  Additionally, symptoms such as coughing, wheezing, and shortness of breath may get better when you quit. You may also find that you get sick less often because your body is stronger at fighting off colds and infections. If you are pregnant, quitting smoking can help to reduce your chances of having a baby of low birth weight. How do I get ready to quit? When you decide to quit smoking, create a plan to make sure that you are successful. Before you quit:  Pick a date to quit. Set a date within the next two weeks to give you time to prepare.  Write down the reasons why you are quitting. Keep this list in places where you will see it often, such as on your bathroom mirror or in your car or wallet.  Identify the people, places, things, and activities that make you want to smoke (triggers) and avoid them. Make sure to take these actions: ? Throw away all cigarettes at home, at work, and in your car. ? Throw away smoking accessories, such as Set designer. ? Clean your car and make sure to empty the ashtray. ? Clean your home, including curtains and carpets.  Tell your family,  friends, and coworkers that you are quitting. Support from your loved ones can make quitting easier.  Talk with your health care provider about your options for quitting smoking.  Find out what treatment options are covered by your health insurance.  What strategies can I use to quit smoking? Talk with your healthcare provider about different strategies to quit smoking. Some strategies include:  Quitting smoking altogether instead of gradually lessening how much you smoke over a period of time. Research shows that quitting "cold [place]" is more successful than gradually quitting.  Attending in-person counseling to help you build problem-solving skills. You are more likely to have success in quitting if you attend several counseling sessions. Even short sessions of 10 minutes can be effective.  Finding resources and support systems that can help you to quit smoking and remain smoke-free after you quit. These resources are most helpful when you use them often. They can include: ? Online chats with a Veterinary surgeon. ? Telephone quitlines. ? Automotive engineer. ? Support groups or group counseling. ? Text messaging programs. ? Mobile phone applications.  Taking medicines to help you quit smoking. (If you are pregnant or breastfeeding, talk with your health care provider first.) Some medicines contain nicotine and some do not. Both types of medicines help with cravings, but the medicines that include nicotine help to relieve withdrawal symptoms. Your health care provider may recommend: ? Nicotine  patches, gum, or lozenges. ? Nicotine inhalers or sprays. ? Non-nicotine medicine that is taken by mouth.  Talk with your health care provider about combining strategies, such as taking medicines while you are also receiving in-person counseling. Using these two strategies together makes you more likely to succeed in quitting than if you used either strategy on its own. If you are pregnant or  breastfeeding, talk with your health care provider about finding counseling or other support strategies to quit smoking. Do not take medicine to help you quit smoking unless told to do so by your health care provider. What things can I do to make it easier to quit? Quitting smoking might feel overwhelming at first, but there is a lot that you can do to make it easier. Take these important actions:  Reach out to your family and friends and ask that they support and encourage you during this time. Call telephone quitlines, reach out to support groups, or work with a counselor for support.  Ask people who smoke to avoid smoking around you.  Avoid places that trigger you to smoke, such as bars, parties, or smoke-break areas at work.  Spend time around people who do not smoke.  Lessen stress in your life, because stress can be a smoking trigger for some people. To lessen stress, try: ? Exercising regularly. ? Deep-breathing exercises. ? Yoga. ? Meditating. ? Performing a body scan. This involves closing your eyes, scanning your body from head to toe, and noticing which parts of your body are particularly tense. Purposefully relax the muscles in those areas.  Download or purchase mobile phone or tablet apps (applications) that can help you stick to your quit plan by providing reminders, tips, and encouragement. There are many free apps, such as QuitGuide from the [employer] developer for Disease Control and Prevention). You can find other support for quitting smoking (smoking cessation) through smokefree.gov and other websites.  How will I feel when I quit smoking? Within the first 24 hours of quitting smoking, you may start to feel some withdrawal symptoms. These symptoms are usually most noticeable 2-3 days after quitting, but they usually do not last beyond 2-3 weeks. Changes or symptoms that you might experience include:  Mood swings.  Restlessness, anxiety, or irritation.  Difficulty  concentrating.  Dizziness.  Strong cravings for sugary foods in addition to nicotine.  Mild weight gain.  Constipation.  Nausea.  Coughing or a sore throat.  Changes in how your medicines work in your body.  A depressed mood.  Difficulty sleeping (insomnia).  After the first 2-3 weeks of quitting, you may start to notice more positive results, such as:  Improved sense of smell and taste.  Decreased coughing and sore throat.  Slower heart rate.  Lower blood pressure.  Clearer skin.  The ability to breathe more easily.  Fewer sick days.  Quitting smoking is very challenging for most people. Do not get discouraged if you are not successful the first time. Some people need to make many attempts to quit before they achieve long-term success. Do your best to stick to your quit plan, and talk with your health care provider if you have any questions or concerns. This information is not intended to replace advice given to you by your health care provider. Make sure you discuss any questions you have with your health care provider. Document Released: 03/30/2001 Document Revised: 12/02/2015 Document Reviewed: 08/20/2014 Elsevier Interactive Patient Education  [employer].

## 2018-01-16 NOTE — Assessment & Plan Note (Signed)
-   Currently vapes tobacco  - Long discussion about risks and danger of vaping >35mins  - Patient understands and appears motivated to quit - Smoking cessation reviewed and given education material to help

## 2018-01-16 NOTE — Assessment & Plan Note (Addendum)
-   BP elevated today, 162/90 - Continues Zebeta 5mg   - Instructed to monitor at home over the next day or two and contact PCP if >140/80  - Denies cp or HA

## 2018-01-16 NOTE — Progress Notes (Signed)
$'@Patient'k$  ID: Priscilla Duke, female    DOB: 1960-11-22, 57 y.o.   MRN: [id number]  Chief Complaint  Patient presents with  . Follow-up    sleep study review    Referring provider: Spry, Marsh Dolly., MD  HPI: 57 year old female, former smoker (quit 2015). PMH pulmonary arteriovenous malformation, embolitic CVA, chronic bronchitis, hemoptysis, seasonal allergic rhinitis. Patient of Dr. Annamaria Boots, last seen in office by pulmonary NP on 01/2016. Referred by Dr. Yamagata/radiology for pulmonary management after coiling of a pulmonary venous malformation.  In December 2015 patient had coiling of her venous malformation. She became more aware of shortness of breath with exertion and increased cough. There was transient hemoptysis after the coiling procedure, now resolved. History of bronchitis since childhood. Parents smoked. Brother had asthma she denies history of pneumonia.  11/30/2017  Patient presents today for follow up visit, she is concerned with recent results on routine CTA monitoring AV malformation. Reviewed results with patient in room, CTA showed single bronchi with mild bronchiectasis in the right upper lobe. No consolidation, effusion, pneumothorax. Hx chronic bronchitis. She has some congestion and chronic cough. Experiences episodes of apnea, she has known OSA but unable to use CPAP due to coil. Needs referral for dental appliance. Recent flare of fatigue. Follows with rheumatology for possible lupus. She's had positive ANA in 2016.  01/16/2018  Patient presents today to go over sleep test results. HST on 12/26/17 showed mild sleep apnea with an AHI 12.2. Lowest O2 sat 87% with an average level of 93%.  Recommend sleep position off back. She has met with Dr. Ron Parker about getting an oral appliance, waiting for insurance and then will need scan/impressions. She continues to vape tobacco, she would like to stop however her husband also vapes which makes it hard for her. Denies sob, cp, cough,  fever.    Allergies  Allergen Reactions  . Penicillins Shortness Of Breath, Itching and Rash  . Shellfish Allergy Shortness Of Breath and Itching  . Lipitor [Atorvastatin] Other (See Comments)    SOB, leg cramps  . Olive Oil Diarrhea and Nausea And Vomiting    Immunization History  Administered Date(s) Administered  . Pneumococcal Polysaccharide-23 03/05/2015  . Td 02/21/2007    Past Medical History:  Diagnosis Date  . Allergy    skin, scalp  . Anxiety   . Arthritis    "down my entire spine; hands, knees" (04/04/2014)  . Chronic bronchitis ([place])    "get it q winter"  . Colonic polyp   . Depression   . Didelphic uterus    Dr. Lennart Pall  . GERD (gastroesophageal reflux disease)   . Headache    "at least weekly" (04/04/2014)  . Heart murmur    as a child and "when I get sick"  . Hemorrhagic stroke ([place]) 2006   right parietal   . Hypercholesterolemia   . Hypertension   . IBS (irritable bowel syndrome)   . Inguinal hernia    right   . Migraine    "a few times/yr" (04/04/2014)  . Mild cognitive impairment since 2006 stroke  . PONV (postoperative nausea and vomiting)    "just after hemorrhoidectomy"  . Seizures ([place])    "none for years now" (04/04/2014)  . Shortness of breath dyspnea   . Stroke [facility]) 2006; 01/04/2014   "mild word finding problems since; mild weakness right side" (04/04/2014)    Tobacco History: Social History   Tobacco Use  Smoking Status Light Tobacco Smoker  .  Packs/day: 1.00  . Years: 20.00  . Pack years: 20.00  . Types: Cigarettes, E-cigarettes  . Last attempt to quit: 01/04/2014  . Years since quitting: 4.0  Smokeless Tobacco Never Used  Tobacco Comment   vapes daily   Ready to quit: Yes Counseling given: Yes Comment: vapes daily   Outpatient Medications Prior to Visit  Medication Sig Dispense Refill  . aspirin 81 MG tablet Take 81 mg by mouth every morning.    Marland Kitchen azithromycin (ZITHROMAX) 250 MG tablet Take both tablets  ('500mg'$ ) 1 hour before dental procedures 2 tablet 0  . bisoprolol (ZEBETA) 5 MG tablet TAKE 1 TABLET BY MOUTH DAILY. 30 tablet 8  . doxycycline (VIBRA-TABS) 100 MG tablet Take 2 tablets 1 hr prior to dental procedure 2 tablet 5  . EPINEPHrine (EPIPEN 2-PAK) 0.3 mg/0.3 mL DEVI Use as directed     . erythromycin ophthalmic ointment Place 1 application into both eyes at bedtime.    Marland Kitchen escitalopram (LEXAPRO) 10 MG tablet '10mg'$  in the morning and '30mg'$  in the evening    . memantine (NAMENDA) 10 MG tablet Take 10 mg by mouth daily.      . Omega-3 Fatty Acids (FISH OIL) 1000 MG CAPS Take 1 capsule by mouth daily.    Marland Kitchen Respiratory Therapy Supplies (FLUTTER) DEVI Use as directed 1 each 0  . rosuvastatin (CRESTOR) 10 MG tablet Take 10 mg by mouth at bedtime.     . VENTOLIN HFA 108 (90 BASE) MCG/ACT inhaler Inhale 2 puffs into the lungs every 4 (four) hours as needed.  5  . zonisamide (ZONEGRAN) 100 MG capsule Take 300 mg by mouth 2 (two) times daily.     . predniSONE (STERAPRED UNI-PAK 21 TAB) 10 MG (21) TBPK tablet Take 10 mg by mouth as directed.     No facility-administered medications prior to visit.     Review of Systems  Review of Systems  Constitutional: Negative.   HENT: Negative.   Respiratory: Negative.   Cardiovascular: Negative.     Physical Exam  BP (!) 162/90   Pulse (!) 58   Ht 5' 2.5" (1.588 m)   Wt 134 lb 12.8 oz (61.1 kg)   SpO2 100%   BMI 24.26 kg/m  Physical Exam  Constitutional: She is oriented to person, place, and time. She appears well-developed and well-nourished.  HENT:  Head: Normocephalic and atraumatic.  Eyes: Pupils are equal, round, and reactive to light. EOM are normal.  Neck: Normal range of motion. Neck supple.  Cardiovascular: Normal rate, regular rhythm and normal heart sounds.  No murmur heard. Pulmonary/Chest: Effort normal and breath sounds normal. No respiratory distress. She has no wheezes.  Abdominal: Soft. Bowel sounds are normal. There is no  tenderness.  Neurological: She is alert and oriented to person, place, and time.  Skin: Skin is warm and dry. No rash noted. No erythema.  Psychiatric: She has a normal mood and affect. Her behavior is normal. Judgment normal.     Lab Results:  CBC    Component Value Date/Time   WBC 7.6 01/29/2016 1635   RBC 4.58 01/29/2016 1635   HGB 13.7 01/29/2016 1635   HCT 40.5 01/29/2016 1635   PLT 254.0 01/29/2016 1635   MCV 88.4 01/29/2016 1635   MCH 27.5 04/08/2014 0715   MCHC 33.9 01/29/2016 1635   RDW 13.5 01/29/2016 1635   LYMPHSABS 1.7 01/29/2016 1635   MONOABS 0.6 01/29/2016 1635   EOSABS 0.1 01/29/2016 1635   BASOSABS 0.0  01/29/2016 1635    BMET    Component Value Date/Time   NA 138 01/29/2016 1635   K 3.7 01/29/2016 1635   CL 101 01/29/2016 1635   CO2 32 01/29/2016 1635   GLUCOSE 142 (H) 01/29/2016 1635   BUN 14 01/29/2016 1635   CREATININE 0.92 01/29/2016 1635   CALCIUM 9.8 01/29/2016 1635   GFRNONAA 75 (L) 04/08/2014 0715   GFRAA 87 (L) 04/08/2014 0715    BNP No results found for: BNP  ProBNP No results found for: PROBNP  Imaging: No results found.   Assessment & Plan:   Essential hypertension - BP elevated today, 162/90 - Continues Zebeta '5mg'$   - Instructed to monitor at home over the next day or two and contact PCP if >140/80  - Denies cp or HA  Apnea, sleep - Mild OSA, AHI 12.2 on HST in September 2019  - Referred to Dr. Ron Parker for oral appliance - Awaiting insurance approval then will proceed with scan/impressions   Tobacco use - Currently vapes tobacco  - Long discussion about risks and danger of vaping >30mns  - Patient understands and appears motivated to quit - Smoking cessation reviewed and given education material to help     EMartyn Ehrich NP 01/16/2018

## 2018-01-16 NOTE — Assessment & Plan Note (Signed)
-   Mild OSA, AHI 12.2 on HST in September 2019  - Referred to Dr. Myrtis Ser for oral appliance - Awaiting insurance approval then will proceed with scan/impressions

## 2018-05-15 ENCOUNTER — Ambulatory Visit: Payer: Managed Care, Other (non HMO) | Admitting: Primary Care

## 2018-05-15 ENCOUNTER — Encounter: Payer: Self-pay | Admitting: Nurse Practitioner

## 2018-05-15 ENCOUNTER — Ambulatory Visit (INDEPENDENT_AMBULATORY_CARE_PROVIDER_SITE_OTHER): Payer: BLUE CROSS/BLUE SHIELD | Admitting: Nurse Practitioner

## 2018-05-15 VITALS — BP 140/90 | HR 55 | Ht 63.0 in | Wt 135.0 lb

## 2018-05-15 DIAGNOSIS — Z720 Tobacco use: Secondary | ICD-10-CM | POA: Diagnosis not present

## 2018-05-15 DIAGNOSIS — I10 Essential (primary) hypertension: Secondary | ICD-10-CM

## 2018-05-15 DIAGNOSIS — G4730 Sleep apnea, unspecified: Secondary | ICD-10-CM | POA: Diagnosis not present

## 2018-05-15 NOTE — Patient Instructions (Signed)
Continue to check BP at home - follow up with PCP for blood pressure Continue cozaar and Zebeta for Blood Pressure Continue oral device for OSA Continue albuterol as needed  Quit vaping before next visit Follow up with Dr. Maple Hudson in 6 months or sooner if needed

## 2018-05-15 NOTE — Progress Notes (Signed)
 @Patient  ID: Priscilla Duke, female    DOB: Dec 23, 1960, 58 y.o.   MRN: [id number]  Chief Complaint  Patient presents with  . Follow-up    Bp is doing better.breathing, has oral appliance that is doing great.    Referring provider: Raynelle JanSpry, Heather M., MD  HPI 58 year old female active smoker (vaping) with OSA and previous history of pulmonary AVM s/p coiling, embolic CVA and chronic rhinitis/bronchitis followed by Dr. Maple HudsonYoung.  Tests: HST 9/19>> Mild OSA AHI 12.2  OV 05/16/18 - follow up OSA Patient presents today for follow-up on OSA.  States that she has started using her oral appliance for OSA.  She states that she is much improved.  She is sleeping better at night and feels much less drowsy during the day.  She wears her oral appliance every night.  Unfortunately patient admits that she is vaping.  She is going to try to quit soon.  [facility] her [facility] pressure was elevated.  She has seen her PCP for this and Cozaar was added to her medication regimen along with Zebeta to control her blood pressure.  She states that her blood pressure has been normal.  It was borderline in the office today at 140/90.  She states that she has been stressed out today over receiving a bill that she did not expect in the mail.  She thinks this is why her blood pressure is a little more elevated today.  Denies any shortness of breath, chest pain, or edema.     Allergies  Allergen Reactions  . Penicillins Shortness Of Breath, Itching and Rash  . Shellfish Allergy Shortness Of Breath and Itching  . Lipitor [Atorvastatin] Other (See Comments)    SOB, leg cramps  . Olive Oil Diarrhea and Nausea And Vomiting    Immunization History  Administered Date(s) Administered  . Influenza, Quadrivalent, Recombinant, Inj, Pf 02/17/2017  . Influenza,inj,Quad PF,6+ Mos 02/24/2012, 01/25/2018  . Pneumococcal Polysaccharide-23 03/05/2015  . Td 06/23/2005, 02/21/2007  . Tdap 02/24/2012     Past Medical History:  Diagnosis Date  . Allergy    skin, scalp  . Anxiety   . Arthritis    "down my entire spine; hands, knees" (04/04/2014)  . Chronic bronchitis ([facility])    "get it q winter"  . Colonic polyp   . Depression   . Didelphic uterus    Dr. Madelaine EtienneKatherine Glenn  . GERD (gastroesophageal reflux disease)   . Headache    "at least weekly" (04/04/2014)  . Heart murmur    as a child and "when I get sick"  . 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 Hypercholesterolemia   . Hypertension   . IBS (irritable bowel syndrome)   . Inguinal hernia    right   . Migraine    "a few times/yr" (04/04/2014)  . Mild cognitive impairment since 2006 stroke  . PONV (postoperative nausea and vomiting)    "just after hemorrhoidectomy"  . Seizures ([facility])    "none for years now" (04/04/2014)  . Shortness of breath dyspnea   . Stroke [facility]) 2006; 01/04/2014   "mild word finding problems since; mild weakness right side" (04/04/2014)    Tobacco History: Social History   Tobacco Use  Smoking Status Light Tobacco Smoker  . Packs/day: 1.00  . Years: 20.00  . Pack years: 20.00  . Types: Cigarettes, E-cigarettes  . Last attempt to quit: 01/04/2014  . Years since quitting: 4.3  Smokeless  Tobacco Never Used  Tobacco Comment   vapes daily   Ready to quit: Yes Counseling given: Yes Comment: vapes daily   Outpatient Encounter Medications as of 05/15/2018  Medication Sig  . aspirin 81 MG tablet Take 81 mg by mouth every morning.  Marland Kitchen azithromycin (ZITHROMAX) 250 MG tablet Take both tablets (500mg ) 1 hour before dental procedures  . bisoprolol (ZEBETA) 5 MG tablet TAKE 1 TABLET BY MOUTH DAILY.  Marland Kitchen donepezil (ARICEPT) 10 MG tablet Take by mouth.  . doxycycline (VIBRA-TABS) 100 MG tablet Take 2 tablets 1 hr prior to dental procedure (Patient taking differently: Take 100 mg by mouth 2 (two) times daily. Take 2 tablets 1 hr prior to dental procedure)  . EPINEPHrine (EPIPEN 2-PAK) 0.3  mg/0.3 mL DEVI Use as directed   . erythromycin ophthalmic ointment Place 1 application into both eyes at bedtime.  Marland Kitchen escitalopram (LEXAPRO) 10 MG tablet 10 mg. 10mg  in the morning and 30mg  in the evening  . losartan (COZAAR) 50 MG tablet   . memantine (NAMENDA) 10 MG tablet Take 10 mg by mouth 2 (two) times daily.   . Omega-3 Fatty Acids (FISH OIL) 1000 MG CAPS Take 1 capsule by mouth daily.  Marland Kitchen Respiratory Therapy Supplies (FLUTTER) DEVI Use as directed  . rosuvastatin (CRESTOR) 10 MG tablet Take 10 mg by mouth at bedtime.   . VENTOLIN HFA 108 (90 BASE) MCG/ACT inhaler Inhale 2 puffs into the lungs every 4 (four) hours as needed.  . zonisamide (ZONEGRAN) 100 MG capsule Take 300 mg by mouth 2 (two) times daily.    No facility-administered encounter medications on file as of 05/15/2018.      Review of Systems  Review of Systems  Constitutional: Negative.  Negative for chills and fever.  HENT: Negative.   Respiratory: Negative for cough, shortness of breath and wheezing.   Cardiovascular: Negative.  Negative for chest pain, palpitations and leg swelling.  Gastrointestinal: Negative.   Allergic/Immunologic: Negative.   Neurological: Negative.   Psychiatric/Behavioral: Negative.        Physical Exam  BP 140/90 (BP Location: Left Arm, Patient Position: Sitting, Cuff Size: Normal)   Pulse (!) 55   Ht 5\' 3"  (1.6 m)   Wt 135 lb (61.2 kg)   SpO2 96%   BMI 23.91 kg/m   Wt Readings from Last 5 Encounters:  05/15/18 135 lb (61.2 kg)  01/16/18 134 lb 12.8 oz (61.1 kg)  11/29/17 128 lb 12.8 oz (58.4 kg)  03/23/16 144 lb (65.3 kg)  02/02/16 145 lb (65.8 kg)     Physical Exam Vitals signs and nursing note reviewed.  Constitutional:      General: She is not in acute distress.    Appearance: She is well-developed.  Cardiovascular:     Rate and Rhythm: Normal rate and regular rhythm.  Pulmonary:     Effort: Pulmonary effort is normal. No respiratory distress.     Breath sounds:  Normal breath sounds. No wheezing or rhonchi.  Musculoskeletal:        General: No swelling.  Neurological:     Mental Status: She is alert and oriented to person, place, and time.       Assessment & Plan:   Apnea, sleep Patient reports that she is doing well with oral appliance for OSA.  Patient Instructions  Continue to check BP at home - follow up with PCP for blood pressure Continue cozaar and Zebeta for Blood Pressure Continue oral device for OSA Continue albuterol as  needed  Quit vaping before next visit Follow up with Dr. Maple Hudson in 6 months or sooner if needed     Essential hypertension Blood pressures have been stable. Continue to monitor.  Patient Instructions  Continue to check BP at home - follow up with PCP for blood pressure Continue cozaar and Zebeta for Blood Pressure Continue oral device for OSA Continue albuterol as needed  Quit vaping before next visit Follow up with Dr. Maple Hudson in 6 months or sooner if needed     Tobacco use Smoking cessation instruction/counseling given:  counseled patient on the dangers of tobacco use, advised patient to stop smoking, and reviewed strategies to maximize success      Ivonne Andrew, NP 05/16/2018

## 2018-05-16 ENCOUNTER — Encounter: Payer: Self-pay | Admitting: Nurse Practitioner

## 2018-05-16 NOTE — Assessment & Plan Note (Signed)
Smoking cessation instruction/counseling given:  counseled patient on the dangers of tobacco use, advised patient to stop smoking, and reviewed strategies to maximize success 

## 2018-05-16 NOTE — Assessment & Plan Note (Signed)
Blood pressures have been stable. Continue to monitor.  Patient Instructions  Continue to check BP at home - follow up with PCP for blood pressure Continue cozaar and Zebeta for Blood Pressure Continue oral device for OSA Continue albuterol as needed  Quit vaping before next visit Follow up with Dr. Maple Hudson in 6 months or sooner if needed

## 2018-05-16 NOTE — Assessment & Plan Note (Signed)
Patient reports that she is doing well with oral appliance for OSA.  Patient Instructions  Continue to check BP at home - follow up with PCP for blood pressure Continue cozaar and Zebeta for Blood Pressure Continue oral device for OSA Continue albuterol as needed  Quit vaping before next visit Follow up with Dr. Maple Hudson in 6 months or sooner if needed

## 2018-05-31 ENCOUNTER — Telehealth: Payer: Self-pay | Admitting: Internal Medicine

## 2018-05-31 NOTE — Telephone Encounter (Signed)
She has been seeing NP's, I haven't seen her in quite a while. Looking back, I don't see a stated need for CT chest. Suggest this order be dc'd. Offer to let her see me next routine available.

## 2018-05-31 NOTE — Telephone Encounter (Signed)
Called and spoke with patient. When patient had her appointment with [place] stated to the patient that everything on her last CT looked good and she wouldn't need another CT. When stacy from [place] [facility] called to verify the CT scan to be scheduled with the patient the patient cancelled it due to [place] stating she didn't need one. CY please advise if you would like patient to do another CT or if she is fine without one. Thank you.

## 2018-05-31 NOTE — Telephone Encounter (Signed)
Called spoke with patient I have canceled order also nothing further needed.

## 2018-06-01 ENCOUNTER — Inpatient Hospital Stay: Admission: RE | Admit: 2018-06-01 | Payer: Managed Care, Other (non HMO) | Source: Ambulatory Visit

## 2018-10-13 ENCOUNTER — Telehealth: Payer: Self-pay | Admitting: Internal Medicine

## 2018-10-13 MED ORDER — CLINDAMYCIN HCL 300 MG PO CAPS: 600.0000 mg | ORAL_CAPSULE | Freq: Once | ORAL | 0 refills | Status: DC

## 2018-10-13 NOTE — Telephone Encounter (Signed)
Pt's spouse notified and he will contact PCP  Will call back to make appt with CDY

## 2018-10-13 NOTE — Telephone Encounter (Signed)
Spouse calling back again asking for abx prior to dental procedure  He states that pt has had a "coil" placed in her lung ordered by Dr Annamaria Boots and that this is the reason why she needs abx before dental work  Dr Annamaria Boots please advise thanks

## 2018-10-13 NOTE — Telephone Encounter (Signed)
Offer cleocin 600 mg ( 300 mg x 2)    Take 1 hour before dental procedure

## 2018-10-13 NOTE — Telephone Encounter (Signed)
rx sent  Spoke with Ray and notified that this was done Nothing further needed

## 2018-10-13 NOTE — Telephone Encounter (Signed)
Spoke with pt's husband, Ray. He is requesting for our office to prescribe the pt an antibiotic for a dental procedure she has on Monday. Advised him that this is something that should come from the pt's PCP. Ray still wanted this message to be sent to [facility] for her recommendation. Pt has not seen CY since 2016.  Tonya - please advise. Thanks.  Allergies  Allergen Reactions  . Penicillins Shortness Of Breath, Itching and Rash  . Shellfish Allergy Shortness Of Breath and Itching  . Lipitor [Atorvastatin] Other (See Comments)    SOB, leg cramps  . Olive Oil Diarrhea and Nausea And Vomiting

## 2018-10-13 NOTE — Telephone Encounter (Signed)
Yes. This would be prescribed by PCP. Thanks. She does need a follow up with Dr. Annamaria Boots in the near future also.

## 2018-10-18 ENCOUNTER — Telehealth: Payer: Self-pay | Admitting: Internal Medicine

## 2018-10-18 MED ORDER — VENTOLIN HFA 108 (90 BASE) MCG/ACT IN AERS: 2.0000 | INHALATION_SPRAY | RESPIRATORY_TRACT | 3 refills | Status: DC | PRN

## 2018-10-18 NOTE — Telephone Encounter (Signed)
Called pt and spoke with pt's husband Ray letting him know that the refill of pt's Ventolin inhaler has been sent to preferred pharmacy for her. Ray verbalized understanding and stated he would let her know. Nothing further needed.

## 2018-11-13 ENCOUNTER — Telehealth: Payer: Self-pay | Admitting: Internal Medicine

## 2018-11-13 ENCOUNTER — Other Ambulatory Visit: Payer: Self-pay | Admitting: Internal Medicine

## 2018-11-13 MED ORDER — DOXYCYCLINE HYCLATE 100 MG PO TABS: ORAL_TABLET | ORAL | 5 refills | Status: AC

## 2018-11-13 NOTE — Telephone Encounter (Signed)
Called and spoke with pt's husband Ray letting him know that we were refilling doxy abx and pt is to take 1hr prior to dental procedure. Ray verbalized understanding. Verified pt's preferred pharmacy and sent Rx in.nothing further needed.

## 2018-11-13 NOTE — Telephone Encounter (Signed)
Ok to refill the doxycycline 100 mg, # 2 tabs    Take 2 tabs 1 hour before dental appointment

## 2018-11-13 NOTE — Telephone Encounter (Signed)
Is this appropriate for refill ? 

## 2018-11-13 NOTE — Telephone Encounter (Signed)
Called and spoke with pt's husband Priscilla Duke. Priscilla Duke stated that pt has a dental appt today at 1:30. He stated that CY  Usually prescribes pt abx prior to going into the dentist. I do see that we have doxy on pt's med list as well as azithromycin which seems to have been prescribed before for pt to take prior to dental procedures. Dr. Annamaria Boots, please advise if both abx need to be prescribed for pt or if you have a preference on which one we should prescribe for pt? Thanks!  Allergies  Allergen Reactions  . Penicillins Shortness Of Breath, Itching and Rash  . Shellfish Allergy Shortness Of Breath and Itching  . Lipitor [Atorvastatin] Other (See Comments)    SOB, leg cramps  . Olive Oil Diarrhea and Nausea And Vomiting     Current Outpatient Medications:  .  aspirin 81 MG tablet, Take 81 mg by mouth every morning., Disp: , Rfl:  .  azithromycin (ZITHROMAX) 250 MG tablet, Take both tablets (500mg ) 1 hour before dental procedures, Disp: 2 tablet, Rfl: 0 .  bisoprolol (ZEBETA) 5 MG tablet, TAKE 1 TABLET BY MOUTH DAILY., Disp: 30 tablet, Rfl: 8 .  donepezil (ARICEPT) 10 MG tablet, Take by mouth., Disp: , Rfl:  .  doxycycline (VIBRA-TABS) 100 MG tablet, Take 2 tablets 1 hr prior to dental procedure (Patient taking differently: Take 100 mg by mouth 2 (two) times daily. Take 2 tablets 1 hr prior to dental procedure), Disp: 2 tablet, Rfl: 5 .  EPINEPHrine (EPIPEN 2-PAK) 0.3 mg/0.3 mL DEVI, Use as directed , Disp: , Rfl:  .  erythromycin ophthalmic ointment, Place 1 application into both eyes at bedtime., Disp: , Rfl:  .  escitalopram (LEXAPRO) 10 MG tablet, 10 mg. 10mg  in the morning and 30mg  in the evening, Disp: , Rfl:  .  losartan (COZAAR) 50 MG tablet, , Disp: , Rfl:  .  memantine (NAMENDA) 10 MG tablet, Take 10 mg by mouth 2 (two) times daily. , Disp: , Rfl:  .  Omega-3 Fatty Acids (FISH OIL) 1000 MG CAPS, Take 1 capsule by mouth daily., Disp: , Rfl:  .  Respiratory Therapy Supplies (FLUTTER) DEVI, Use as  directed, Disp: 1 each, Rfl: 0 .  rosuvastatin (CRESTOR) 10 MG tablet, Take 10 mg by mouth at bedtime. , Disp: , Rfl:  .  VENTOLIN HFA 108 (90 Base) MCG/ACT inhaler, Inhale 2 puffs into the lungs every 4 (four) hours as needed., Disp: 18 g, Rfl: 3 .  zonisamide (ZONEGRAN) 100 MG capsule, Take 300 mg by mouth 2 (two) times daily. , Disp: , Rfl:

## 2018-11-13 NOTE — Telephone Encounter (Signed)
Clindamycin refill sent. A doxycycline script was sent earlier this morning for dental visit. She can use which ever her dentist prefers, but I don't think she needs both.

## 2019-09-18 ENCOUNTER — Other Ambulatory Visit: Payer: Self-pay | Admitting: Internal Medicine

## 2019-09-19 ENCOUNTER — Telehealth: Payer: Self-pay | Admitting: Internal Medicine

## 2019-09-19 NOTE — Telephone Encounter (Signed)
Spoke with Ray and he is requesting an antibiotic to be sent in for her dental procedure. He states CY usually prescribes clindamycin for pt. CY please advise.  Current Outpatient Medications on File Prior to Visit  Medication Sig Dispense Refill  . aspirin 81 MG tablet Take 81 mg by mouth every morning.    . bisoprolol (ZEBETA) 5 MG tablet TAKE 1 TABLET BY MOUTH DAILY. 30 tablet 8  . clindamycin (CLEOCIN) 300 MG capsule TAKE 2 CAPSULES BY MOUTH ONCE FOR 1 DOSE (1 HOUR PRIOR TO PROCEDURE) 2 capsule 0  . donepezil (ARICEPT) 10 MG tablet Take by mouth.    . doxycycline (VIBRA-TABS) 100 MG tablet Take 2 tablets 1 hr prior to dental procedure 2 tablet 5  . EPINEPHrine (EPIPEN 2-PAK) 0.3 mg/0.3 mL DEVI Use as directed     . erythromycin ophthalmic ointment Place 1 application into both eyes at bedtime.    Marland Kitchen escitalopram (LEXAPRO) 10 MG tablet 10 mg. 10mg  in the morning and 30mg  in the evening    . losartan (COZAAR) 50 MG tablet     . memantine (NAMENDA) 10 MG tablet Take 10 mg by mouth 2 (two) times daily.     . Omega-3 Fatty Acids (FISH OIL) 1000 MG CAPS Take 1 capsule by mouth daily.    Respiratory Therapy Supplies (FLUTTER) DEVI Use as directed 1 each 0  . rosuvastatin (CRESTOR) 10 MG tablet Take 10 mg by mouth at bedtime.     . VENTOLIN HFA 108 (90 Base) MCG/ACT inhaler Inhale 2 puffs into the lungs every 4 (four) hours as needed. 18 g 3  . zonisamide (ZONEGRAN) 100 MG capsule Take 300 mg by mouth 2 (two) times daily.      No current facility-administered medications on file prior to visit.   Allergies  Allergen Reactions  . Penicillins Shortness Of Breath, Itching and Rash  . Shellfish Allergy Shortness Of Breath and Itching  . Lipitor [Atorvastatin] Other (See Comments)    SOB, leg cramps  . Olive Oil Diarrhea and Nausea And Vomiting

## 2019-09-20 MED ORDER — CLINDAMYCIN HCL 300 MG PO CAPS: ORAL_CAPSULE | ORAL | 0 refills | Status: AC

## 2019-09-20 NOTE — Telephone Encounter (Signed)
clindamycin refill e-sent

## 2019-09-20 NOTE — Telephone Encounter (Signed)
Patient notified that antibiotic was sent to her pharmacy. Nothing further needed at this time.

## 2020-01-25 ENCOUNTER — Telehealth: Payer: Self-pay | Admitting: Internal Medicine

## 2020-01-25 MED ORDER — VENTOLIN HFA 108 (90 BASE) MCG/ACT IN AERS: 2.0000 | INHALATION_SPRAY | RESPIRATORY_TRACT | 12 refills | Status: AC | PRN

## 2020-01-25 NOTE — Telephone Encounter (Signed)
Ventlolin inhaler refill sent to [facility] on [address] in [place], [place]

## 2020-01-25 NOTE — Telephone Encounter (Signed)
Pt is requesting refill on albuterol states she moved to [place]  [place] [facility] [place]

## 2020-12-24 ENCOUNTER — Telehealth: Payer: Self-pay | Admitting: Student

## 2020-12-24 NOTE — Telephone Encounter (Signed)
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 Fredia Sorrow in 2015.   Received a call from Mrs. and Mr. Setzer today, they stated that they read that the coli is only good for 9 - 12 years and wondering if she need to be seen by Dr. Fredia Sorrow.  She reported chronic chest pain, lightheadedness, and shortness of breath, but no new cardiopulmonary symptoms.   Confirmed with Dr. Fredia Sorrow that the colils and the vascular plug are incorporated into the occluded vessel indefinitely.  Informed  Mrs. Husk above discussion via telephone.  Patient verbalized understanding.   Please call IR for questions and concerns.   Charod Slawinski Rexene Edison Aubryana Vittorio PA-C 12/24/2020 2:30 PM

## 2021-08-12 ENCOUNTER — Other Ambulatory Visit: Payer: Self-pay | Admitting: Interventional Radiology

## 2021-08-12 DIAGNOSIS — Q2730 Arteriovenous malformation, site unspecified: Secondary | ICD-10-CM

## 2021-08-25 ENCOUNTER — Other Ambulatory Visit: Payer: Self-pay | Admitting: Interventional Radiology

## 2021-08-25 DIAGNOSIS — Q2730 Arteriovenous malformation, site unspecified: Secondary | ICD-10-CM

## 2021-09-17 ENCOUNTER — Ambulatory Visit (HOSPITAL_COMMUNITY)
Admission: RE | Admit: 2021-09-17 | Discharge: 2021-09-17 | Disposition: A | Discharge status: Home / Self Care | Payer: Medicare PPO | Source: Ambulatory Visit | Attending: Interventional Radiology | Admitting: Interventional Radiology

## 2021-09-17 DIAGNOSIS — Q2730 Arteriovenous malformation, site unspecified: Secondary | ICD-10-CM | POA: Insufficient documentation

## 2021-09-17 LAB — POCT I-STAT CREATININE: Creatinine, Ser: 1.1 mg/dL — ABNORMAL HIGH (ref 0.44–1.00)

## 2021-09-17 MED ORDER — IOHEXOL 350 MG/ML SOLN
75.0000 mL | Freq: Once | INTRAVENOUS | Status: AC | PRN
  Administered 2021-09-17: 75 mL via INTRAVENOUS

## 2021-09-21 ENCOUNTER — Ambulatory Visit
Admission: RE | Admit: 2021-09-21 | Discharge: 2021-09-21 | Disposition: A | Discharge status: Home / Self Care | Payer: Medicare PPO | Source: Ambulatory Visit | Attending: Interventional Radiology | Admitting: Interventional Radiology

## 2021-09-21 DIAGNOSIS — Q2730 Arteriovenous malformation, site unspecified: Secondary | ICD-10-CM

## 2021-09-21 HISTORY — PX: IR RADIOLOGIST EVAL & MGMT: IMG5224

## 2021-09-21 NOTE — Progress Notes (Signed)
Chief Complaint: Patient was consulted remotely today (TeleHealth) for follow up status post transcatheter embolization of right middle lobe pulmonary AV malformation on 04/04/2014.  History of Present Illness: Priscilla Duke is a 61 y.o. female post transcatheter embolization of a right middle lobe pulmonary AVM on 04/04/2014.  She moved to [place], [place] about 3 years ago. She has had some persistent discomfort in the right chest when she lies with her right side down since the procedure, but that has improved over time and she has no pain in other positions.  Past Medical History:  Diagnosis Date   Allergy    skin, scalp   Anxiety    Arthritis    "down my entire spine; hands, knees" (04/04/2014)   Chronic bronchitis (HCC)    "get it q winter"   Colonic polyp    Depression    Didelphic uterus    Dr. 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    Hypercholesterolemia    Hypertension    IBS (irritable bowel syndrome)    Inguinal hernia    right    Migraine    "a few times/yr" (04/04/2014)   Mild cognitive impairment since 2006 stroke   PONV (postoperative nausea and vomiting)    "just after hemorrhoidectomy"   Seizures (HCC)    "none for years now" (04/04/2014)   Shortness of breath dyspnea    Stroke (HCC) 2006; 01/04/2014   "mild word finding problems since; mild weakness right side" (04/04/2014)    Past Surgical History:  Procedure Laterality Date   ABDOMINOPLASTY  2007   ANAL FISSURE REPAIR  1981   due to tear   AUGMENTATION MAMMAPLASTY  2007   BREAST IMPLANT REMOVAL  05/2009   w/lift   COLONOSCOPY W/ BIOPSIES AND POLYPECTOMY     EXCISIONAL HEMORRHOIDECTOMY  ~ 2009   IR GENERIC HISTORICAL  05/08/2014   IR RADIOLOGIST EVAL & MGMT 05/08/2014 [name], MD [facility] INTERV RAD   IR RADIOLOGIST EVAL &  MGMT  08/17/2016   RADIOLOGY WITH ANESTHESIA  04/04/2014   transcatheter embolization of right middle lobe AVM   RADIOLOGY WITH ANESTHESIA N/A 04/04/2014   Procedure: RADIOLOGY WITH ANESTHESIA;  Surgeon: Reola Calkins, MD;  Location: [facility] OR;  Service: Radiology;  Laterality: N/A;  DR. Fredia Sorrow PERFORMING   TUBAL LIGATION  1998   VAGINAL HYSTERECTOMY  2003    Allergies: Penicillins, Shellfish allergy, Lipitor [atorvastatin], and Olive oil  Medications: Prior to Admission medications   Medication Sig Start Date End Date Taking? Authorizing Provider  aspirin 81 MG tablet Take 81 mg by mouth every morning.    [provider]  bisoprolol (ZEBETA) 5 MG tablet TAKE 1 TABLET BY MOUTH DAILY. 09/19/17   [place], Peter M, MD  clindamycin (CLEOCIN) 300 MG capsule TAKE 2 CAPSULES BY MOUTH ONCE FOR 1 DOSE (1 HOUR PRIOR TO PROCEDURE) 09/20/19   Jetty Duhamel D, MD  donepezil (ARICEPT) 10 MG tablet Take by mouth. 05/02/18   [provider]  doxycycline (VIBRA-TABS) 100 MG tablet Take 2 tablets 1 hr prior to dental procedure 11/13/18   Waymon Budge, MD  EPINEPHrine (EPIPEN 2-PAK) 0.3 mg/0.3 mL DEVI Use as directed     [provider]  erythromycin ophthalmic ointment Place 1 application into  both eyes at bedtime.    [provider]  escitalopram (LEXAPRO) 10 MG tablet 10 mg.  in the morning and  in the evening    [provider]  losartan (COZAAR) 50 MG tablet  04/13/18   [provider]  memantine (NAMENDA) 10 MG tablet Take 10 mg by mouth 2 (two) times daily.     [provider]  Omega-3 Fatty Acids (FISH OIL) 1000 MG CAPS Take 1 capsule by mouth daily.    [provider]  Respiratory Therapy Supplies (FLUTTER) DEVI Use as directed 11/29/17   Glenford Bayley, NP  rosuvastatin (CRESTOR) 10 MG tablet Take 10 mg by mouth at bedtime.     [provider]  VENTOLIN HFA 108 (90 Base) MCG/ACT inhaler Inhale 2 puffs into the  lungs every 4 (four) hours as needed. 01/25/20   Jetty Duhamel D, MD  zonisamide (ZONEGRAN) 100 MG capsule Take 300 mg by mouth 2 (two) times daily.     [provider]     Family History  Problem Relation Age of Onset   Aneurysm Sister    Hypertension Other    Hypertension Mother    Stroke Mother    Migraines Mother    Seizures Father    Asthma Brother     Social History   Socioeconomic History   Marital status: Married    Spouse name: Not on file   Number of children: 5   Years of education: Not on file   Highest education level: Not on file  Occupational History   Occupation: Disability, former Doctor, general practice    Employer: DISABLED  Tobacco Use   Smoking status: Light Smoker    Packs/day: 1.00    Years: 20.00    Pack years: 20.00    Types: Cigarettes, E-cigarettes    Last attempt to quit: 01/04/2014    Years since quitting: 7.7   Smokeless tobacco: Never   Tobacco comments:    vapes daily  Substance and Sexual Activity   Alcohol use: Yes    Alcohol/week: 7.0 standard drinks    Types: 7 Glasses of wine per week   Drug use: No   Sexual activity: Yes  Other Topics Concern   Not on file  Social History Narrative   Not on file   Social Determinants of Health   Financial Resource Strain: Not on file  Food Insecurity: Not on file  Transportation Needs: Not on file  Physical Activity: Not on file  Stress: Not on file  Social Connections: Not on file    Review of Systems  Constitutional: Negative.   Respiratory: Negative.    Cardiovascular:        Some right chest discomfort when laying with right side down.  Gastrointestinal: Negative.   Genitourinary: Negative.   Musculoskeletal: Negative.   Neurological: Negative.    Review of Systems: A 12 point ROS discussed and pertinent positives are indicated in the HPI above.  All other systems are negative.  Physical Exam No direct physical exam was performed (except for noted visual exam findings  with Video Visits).    Vital Signs: There were no vitals taken for this visit.  Imaging: CT ANGIO CHEST AORTA W/CM & OR WO/CM  Result Date: 09/18/2021 CLINICAL DATA:  Status post transcatheter embolization and occlusion of right middle lobe pulmonary AV malformation on 04/04/2014. EXAM: CT ANGIOGRAPHY CHEST WITH CONTRAST TECHNIQUE: Multidetector CT imaging of the chest was performed using the standard protocol during bolus administration  of intravenous contrast. Multiplanar CT image reconstructions and MIPs were obtained to evaluate the vascular anatomy. RADIATION DOSE REDUCTION: This exam was performed according to the departmental dose-optimization program which includes automated exposure control, adjustment of the mA and/or kV according to patient size and/or use of iterative reconstruction technique. CONTRAST:  75mL OMNIPAQUE IOHEXOL 350 MG/ML SOLN COMPARISON:  None Available. FINDINGS: Cardiovascular: Stable configuration of embolization coils and microvascular plug at the level of the previously occluded and obliterated right middle lobe AV malformation. There is no evidence of recanalized flow in the malformation or new thrombus formation. No other AV malformations are identified in the lungs bilaterally. Central pulmonary arteries are normal in caliber. The heart size is normal. No pericardial fluid identified. The thoracic aorta is normal in caliber and demonstrates no atherosclerosis. Mediastinum/Nodes: No enlarged mediastinal, hilar, or axillary lymph nodes. Thyroid gland, trachea, and esophagus demonstrate no significant findings. Lungs/Pleura: There is no evidence of pulmonary edema, consolidation, pneumothorax, nodule or pleural fluid. Upper Abdomen: No acute abnormality. Musculoskeletal: No chest wall abnormality. No acute or significant osseous findings. Review of the MIP images confirms the above findings. IMPRESSION: Stable obliterated right middle lobe AV malformation after prior  transcatheter embolization and occlusion. No recanalized inflow identified. No additional or new pulmonary AV malformations identified. Electronically Signed   By: Irish LackGlenn  Keshonda Monsour M.D.   On: 09/18/2021 10:17    Labs:  CBC: No results for input(s): WBC, HGB, HCT, PLT in the last 8760 hours.  COAGS: No results for input(s): INR, APTT in the last 8760 hours.  BMP: Recent Labs    09/17/21 1518  CREATININE 1.10*     Assessment and Plan:  I spoke with Priscilla Duke over the phone.  We reviewed imaging findings from the follow-up CTA dated 09/17/2021 that demonstrates continued obliteration and occlusion of the previously treated right middle lobe AV malformation with no recanalized flow.  No new pulmonary AV malformations are identified.  Given durable occlusion now for 7-1/2 years after treatment, I did not recommend any additional routine follow-up imaging.  Priscilla Duke will contact me should she experience any new concerning symptoms.    Electronically Signed: Reola CalkinsGlenn T Annlouise Gerety 09/21/2021, 12:44 PM    I spent a total of 10 Minutes in remote  clinical consultation, greater than 50% of which was counseling/coordinating care post embolization of a right middle lobe arteriovenous malformation.    Visit type: Audio only (telephone). Audio (no video) only due to patient's lack of internet/smartphone capability. [facility] for [facility], [address], [place], [place]. This visit type was conducted due to national recommendations for restrictions regarding the COVID-19 Pandemic (e.g. social distancing).  This format is felt to be most appropriate for this patient at this time.  All issues noted in this document were discussed and addressed.
